# Patient Record
Sex: Female | Born: 1980 | Race: Black or African American | Hispanic: No | Marital: Married
Health system: Midwestern US, Community
[De-identification: ages and names within clinical notes are randomized; demographics above are authoritative.]

## PROBLEM LIST (undated history)

## (undated) ENCOUNTER — Inpatient Hospital Stay (HOSPITAL_COMMUNITY): Payer: Self-pay

## (undated) DIAGNOSIS — R87619 Unspecified abnormal cytological findings in specimens from cervix uteri: Secondary | ICD-10-CM

## (undated) DIAGNOSIS — I2699 Other pulmonary embolism without acute cor pulmonale: Secondary | ICD-10-CM

## (undated) DIAGNOSIS — B009 Herpesviral infection, unspecified: Secondary | ICD-10-CM

## (undated) HISTORY — DX: Unspecified abnormal cytological findings in specimens from cervix uteri: R87.619

## (undated) HISTORY — PX: NO PAST SURGERIES: SHX2092

## (undated) HISTORY — DX: Herpesviral infection, unspecified: B00.9

---

## 2013-05-03 DIAGNOSIS — I2699 Other pulmonary embolism without acute cor pulmonale: Secondary | ICD-10-CM | POA: Insufficient documentation

## 2013-06-06 DIAGNOSIS — Z1589 Genetic susceptibility to other disease: Secondary | ICD-10-CM | POA: Insufficient documentation

## 2013-10-31 DIAGNOSIS — G43909 Migraine, unspecified, not intractable, without status migrainosus: Secondary | ICD-10-CM | POA: Insufficient documentation

## 2014-07-13 NOTE — L&D Delivery Note (Cosign Needed)
Patient is 34 y.o. [id number] 1281w0d admitted for IOL, hx of PE now on Lovenox   Delivery Note At 2:51 PM a viable female was delivered via Vaginal, Spontaneous Delivery (Presentation: Left Occiput Anterior).  APGAR:9 ,9 ; weight pending .   Placenta status: Intact, Spontaneous.  Cord: 3 vessels with the following complications: None.   Anesthesia: None  Episiotomy: None Lacerations:  Superficial that did not require repair Suture Repair: n/a Est. Blood Loss (mL): 300cc   Mom to postpartum.  Baby to Couplet care / Skin to Skin.  Home Lovenox 40 scheduled to start 12 hours from now (0330am).  Delynn FlavinGottschalk, Ashly M, DO 10/22/2014, 3:17 PM   Patient is a 34 y.o. at 39w0 who was admitted for induction of labor secondary to history of PE (on Lovenox). Prenatal course was complicated by late to prenatal care, but otherwise uncomplicated. She progressed with augmentation via pitocin and AROM.  I was gloved and present for delivery in its entirety.  Second stage of labor progressed. She delivered a viable female over an intact perineum without medications.  Complications: none  Lacerations: none  EBL: 300  William DaltonMcEachern, Kenyetta Fife, MD 3:58 PM

## 2014-07-27 ENCOUNTER — Encounter: Payer: Self-pay | Admitting: Family

## 2014-07-27 ENCOUNTER — Ambulatory Visit (INDEPENDENT_AMBULATORY_CARE_PROVIDER_SITE_OTHER): Payer: Medicaid Other | Admitting: Family

## 2014-07-27 ENCOUNTER — Other Ambulatory Visit (HOSPITAL_COMMUNITY)
Admission: RE | Admit: 2014-07-27 | Discharge: 2014-07-27 | Disposition: A | Discharge status: Home / Self Care | Payer: Medicaid Other | Source: Ambulatory Visit | Attending: Family | Admitting: Family

## 2014-07-27 VITALS — BP 126/74 | HR 74 | Ht 70.0 in | Wt 155.0 lb

## 2014-07-27 DIAGNOSIS — B009 Herpesviral infection, unspecified: Secondary | ICD-10-CM | POA: Insufficient documentation

## 2014-07-27 DIAGNOSIS — O98519 Other viral diseases complicating pregnancy, unspecified trimester: Secondary | ICD-10-CM

## 2014-07-27 DIAGNOSIS — Z113 Encounter for screening for infections with a predominantly sexual mode of transmission: Secondary | ICD-10-CM | POA: Insufficient documentation

## 2014-07-27 DIAGNOSIS — Z23 Encounter for immunization: Secondary | ICD-10-CM | POA: Diagnosis not present

## 2014-07-27 DIAGNOSIS — Z01419 Encounter for gynecological examination (general) (routine) without abnormal findings: Secondary | ICD-10-CM | POA: Diagnosis not present

## 2014-07-27 DIAGNOSIS — O0932 Supervision of pregnancy with insufficient antenatal care, second trimester: Secondary | ICD-10-CM

## 2014-07-27 DIAGNOSIS — Z1151 Encounter for screening for human papillomavirus (HPV): Secondary | ICD-10-CM | POA: Diagnosis present

## 2014-07-27 DIAGNOSIS — O09892 Supervision of other high risk pregnancies, second trimester: Secondary | ICD-10-CM | POA: Insufficient documentation

## 2014-07-27 DIAGNOSIS — O09212 Supervision of pregnancy with history of pre-term labor, second trimester: Secondary | ICD-10-CM

## 2014-07-27 DIAGNOSIS — O0930 Supervision of pregnancy with insufficient antenatal care, unspecified trimester: Secondary | ICD-10-CM | POA: Insufficient documentation

## 2014-07-27 MED ORDER — INFLUENZA VAC SPLIT QUAD 0.5 ML IM SUSY
0.5000 mL | PREFILLED_SYRINGE | Freq: Once | INTRAMUSCULAR | Status: AC
  Administered 2014-07-27: 0.5 mL via INTRAMUSCULAR

## 2014-07-27 NOTE — Progress Notes (Signed)
Subjective:    Bridget Clark is a [id number] [redacted]w[redacted]d by uncertain LMP being seen today for her first obstetrical visit.  [place] moved from [place].  Her obstetrical history is significant for group B strep colonizer and preterm delivery with two term births.    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  Patient does intend to breast feed. Pregnancy history fully reviewed.  Patient reports no complaints.  Filed Vitals:   07/27/14 1054 08/02/14 1130  BP: 126/74   Pulse: 74   Height:   (1.778 m)  Weight: 70.308 kg (155 lb)     HISTORY: OB History  Gravida Para Term Preterm AB SAB TAB Ectopic Multiple Living  0 0 0 0 0 3    # Outcome Date GA Lbr Len/2nd Weight Sex Delivery Anes PTL Lv  4 Current           3 Term 12/10/07 [redacted]w[redacted]d  3.175 kg (7 lb) F Vag-Spont None N Y  2 Preterm 03/28/04 [redacted]w[redacted]d  2.722 kg (6 lb) M Vag-Spont None Y Y  1 Term 09/08/02 [redacted]w[redacted]d  3.487 kg (7 lb 11 oz) F Vag-Spont None N Y     Past Medical History  Diagnosis Date  . HSV-2 (herpes simplex virus 2) infection   . Abnormal Pap smear of cervix     2009, colpo, normal pap after   No past surgical history on file. Family History  Problem Relation Age of Onset  . Diabetes Mother     Exam   BP 126/74 mmHg  Pulse 74  Ht  (1.778 m)  Wt 70.308 kg (155 lb)  LMP 01/26/2014 (Approximate) Uterine Size: size equals dates  Pelvic Exam:    Perineum: No Hemorrhoids, Normal Perineum   Vulva: normal   Vagina:  normal mucosa, normal discharge, no palpable nodules   pH: Not done   Cervix: no bleeding following Pap, no cervical motion tenderness and no lesions   Adnexa: normal adnexa and no mass, fullness, tenderness   Bony Pelvis: Adequate  System: Breast:  No nipple retraction or dimpling, No nipple discharge or bleeding, No axillary or supraclavicular adenopathy, Normal to palpation without dominant masses   Skin: normal coloration and turgor, no rashes    Neurologic: negative   Extremities: normal strength, tone, and muscle mass   HEENT neck supple with midline trachea and thyroid without masses   Mouth/Teeth mucous membranes moist, pharynx normal without lesions   Neck supple and no masses   Cardiovascular: regular rate and rhythm, no murmurs or gallops   Respiratory:  appears well, vitals normal, no respiratory distress, acyanotic, normal RR, neck free of mass or lymphadenopathy, chest clear, no wheezing, crepitations, rhonchi, normal symmetric air entry   Abdomen: soft, non-tender; bowel sounds normal; no masses,  no organomegaly   Urinary: urethral meatus normal    Assessment:    Pregnancy:   34 yo G4P2106 at [redacted]w[redacted]d wks IUP Patient Active Problem List   Diagnosis Date Noted  . Encounter for fetal anatomic survey   . Late prenatal care affecting pregnancy in second trimester   . [redacted] weeks gestation of pregnancy   . Late prenatal care 07/27/2014  . History of preterm delivery, currently pregnant in second trimester 07/27/2014  . Herpes simplex infection during pregnancy, antepartum 07/27/2014        Plan:     Initial labs drawn. Prenatal vitamins. Problem list reviewed and updated. Genetic Screening:  Too late  Reviewed starting HSV-2 prophylaxis in late third trimester.   Ultrasound discussed; fetal survey: ordered.  Follow up in 2 weeks.  Marlis EdelsonKARIM, Costas Sena N 08/04/2014

## 2014-07-27 NOTE — Patient Instructions (Signed)
Third Trimester of Pregnancy The third trimester is from week 29 through week 42, months 7 through 9. The third trimester is a time when the fetus is growing rapidly. At the end of the ninth month, the fetus is about 20 inches in length and weighs 6-10 pounds.  BODY CHANGES Your body goes through many changes during pregnancy. The changes vary from woman to woman.   Your weight will continue to increase. You can expect to gain 25-35 pounds (11-16 kg) by the end of the pregnancy.  You may begin to get stretch marks on your hips, abdomen, and breasts.  You may urinate more often because the fetus is moving lower into your pelvis and pressing on your bladder.  You may develop or continue to have heartburn as a result of your pregnancy.  You may develop constipation because certain hormones are causing the muscles that push waste through your intestines to slow down.  You may develop hemorrhoids or swollen, bulging veins (varicose veins).  You may have pelvic pain because of the weight gain and pregnancy hormones relaxing your joints between the bones in your pelvis. Backaches may result from overexertion of the muscles supporting your posture.  You may have changes in your hair. These can include thickening of your hair, rapid growth, and changes in texture. Some women also have hair loss during or after pregnancy, or hair that feels dry or thin. Your hair will most likely return to normal after your baby is born.  Your breasts will continue to grow and be tender. A yellow discharge may leak from your breasts called colostrum.  Your belly button may stick out.  You may feel short of breath because of your expanding uterus.  You may notice the fetus "dropping," or moving lower in your abdomen.  You may have a bloody mucus discharge. This usually occurs a few days to a week before labor begins.  Your cervix becomes thin and soft (effaced) near your due date. WHAT TO EXPECT AT YOUR PRENATAL  EXAMS  You will have prenatal exams every 2 weeks until week 36. Then, you will have weekly prenatal exams. During a routine prenatal visit:  You will be weighed to make sure you and the fetus are growing normally.  Your blood pressure is taken.  Your abdomen will be measured to track your baby's growth.  The fetal heartbeat will be listened to.  Any test results from the previous visit will be discussed.  You may have a cervical check near your due date to see if you have effaced. At around 36 weeks, your caregiver will check your cervix. At the same time, your caregiver will also perform a test on the secretions of the vaginal tissue. This test is to determine if a type of bacteria, Group B streptococcus, is present. Your caregiver will explain this further. Your caregiver may ask you:  What your birth plan is.  How you are feeling.  If you are feeling the baby move.  If you have had any abnormal symptoms, such as leaking fluid, bleeding, severe headaches, or abdominal cramping.  If you have any questions. Other tests or screenings that may be performed during your third trimester include:  Blood tests that check for low iron levels (anemia).  Fetal testing to check the health, activity level, and growth of the fetus. Testing is done if you have certain medical conditions or if there are problems during the pregnancy. FALSE LABOR You may feel small, irregular contractions that   eventually go away. These are called Braxton Hicks contractions, or false labor. Contractions may last for hours, days, or even weeks before true labor sets in. If contractions come at regular intervals, intensify, or become painful, it is best to be seen by your caregiver.  SIGNS OF LABOR   Menstrual-like cramps.  Contractions that are 5 minutes apart or less.  Contractions that start on the top of the uterus and spread down to the lower abdomen and back.  A sense of increased pelvic pressure or back  pain.  A watery or bloody mucus discharge that comes from the vagina. If you have any of these signs before the 37th week of pregnancy, call your caregiver right away. You need to go to the hospital to get checked immediately. HOME CARE INSTRUCTIONS   Avoid all smoking, herbs, alcohol, and unprescribed drugs. These chemicals affect the formation and growth of the baby.  Follow your caregiver's instructions regarding medicine use. There are medicines that are either safe or unsafe to take during pregnancy.  Exercise only as directed by your caregiver. Experiencing uterine cramps is a good sign to stop exercising.  Continue to eat regular, healthy meals.  Wear a good support bra for breast tenderness.  Do not use hot tubs, steam rooms, or saunas.  Wear your seat belt at all times when driving.  Avoid raw meat, uncooked cheese, cat litter boxes, and soil used by cats. These carry germs that can cause birth defects in the baby.  Take your prenatal vitamins.  Try taking a stool softener (if your caregiver approves) if you develop constipation. Eat more high-fiber foods, such as fresh vegetables or fruit and whole grains. Drink plenty of fluids to keep your urine clear or pale yellow.  Take warm sitz baths to soothe any pain or discomfort caused by hemorrhoids. Use hemorrhoid cream if your caregiver approves.  If you develop varicose veins, wear support hose. Elevate your feet for 15 minutes, 3-4 times a day. Limit salt in your diet.  Avoid heavy lifting, wear low heal shoes, and practice good posture.  Rest a lot with your legs elevated if you have leg cramps or low back pain.  Visit your dentist if you have not gone during your pregnancy. Use a soft toothbrush to brush your teeth and be gentle when you floss.  A sexual relationship may be continued unless your caregiver directs you otherwise.  Do not travel far distances unless it is absolutely necessary and only with the approval  of your caregiver.  Take prenatal classes to understand, practice, and ask questions about the labor and delivery.  Make a trial run to the hospital.  Pack your hospital bag.  Prepare the baby's nursery.  Continue to go to all your prenatal visits as directed by your caregiver. SEEK MEDICAL CARE IF:  You are unsure if you are in labor or if your water has broken.  You have dizziness.  You have mild pelvic cramps, pelvic pressure, or nagging pain in your abdominal area.  You have persistent nausea, vomiting, or diarrhea.  You have a bad smelling vaginal discharge.  You have pain with urination. SEEK IMMEDIATE MEDICAL CARE IF:   You have a fever.  You are leaking fluid from your vagina.  You have spotting or bleeding from your vagina.  You have severe abdominal cramping or pain.  You have rapid weight loss or gain.  You have shortness of breath with chest pain.  You notice sudden or extreme swelling   of your face, hands, ankles, feet, or legs.  You have not felt your baby move in over an hour.  You have severe headaches that do not go away with medicine.  You have vision changes. Document Released: 06/23/2001 Document Revised: 07/04/2013 Document Reviewed: 08/30/2012 Exit[employer] Patient Information 2015 [employer]. This information is not intended to replace advice given to you by your health care provider. Make sure you discuss any questions you have with your health care provider.  

## 2014-07-28 LAB — HIV ANTIBODY (ROUTINE TESTING W REFLEX): HIV 1&2 Ab, 4th Generation: NONREACTIVE

## 2014-07-28 LAB — SICKLE CELL SCREEN: Sickle Cell Screen: NEGATIVE

## 2014-07-29 LAB — CULTURE, URINE COMPREHENSIVE
Colony Count: NO GROWTH
Organism ID, Bacteria: NO GROWTH

## 2014-07-30 LAB — OBSTETRIC PANEL
Antibody Screen: NEGATIVE
BASOS ABS: 0 10*3/uL (ref 0.0–0.1)
BASOS PCT: 0 % (ref 0–1)
EOS PCT: 2 % (ref 0–5)
Eosinophils Absolute: 0.2 10*3/uL (ref 0.0–0.7)
HCT: 36.8 % (ref 36.0–46.0)
Hemoglobin: 12.2 g/dL (ref 12.0–15.0)
Hepatitis B Surface Ag: NEGATIVE
LYMPHS ABS: 1.6 10*3/uL (ref 0.7–4.0)
LYMPHS PCT: 18 % (ref 12–46)
MCH: 30.8 pg (ref 26.0–34.0)
MCHC: 33.2 g/dL (ref 30.0–36.0)
MCV: 92.9 fL (ref 78.0–100.0)
MPV: 11 fL (ref 8.6–12.4)
Monocytes Absolute: 0.6 10*3/uL (ref 0.1–1.0)
Monocytes Relative: 7 % (ref 3–12)
NEUTROS PCT: 73 % (ref 43–77)
Neutro Abs: 6.4 10*3/uL (ref 1.7–7.7)
PLATELETS: 264 10*3/uL (ref 150–400)
RBC: 3.96 MIL/uL (ref 3.87–5.11)
RDW: 12.9 % (ref 11.5–15.5)
RUBELLA: 3.69 {index} — AB (ref <0.90)
Rh Type: POSITIVE
WBC: 8.8 10*3/uL (ref 4.0–10.5)

## 2014-07-30 LAB — CYTOLOGY - PAP

## 2014-08-02 ENCOUNTER — Encounter: Payer: Self-pay | Admitting: *Deleted

## 2014-08-02 ENCOUNTER — Ambulatory Visit (HOSPITAL_COMMUNITY)
Admission: RE | Admit: 2014-08-02 | Discharge: 2014-08-02 | Disposition: A | Discharge status: Home / Self Care | Payer: Medicaid Other | Source: Ambulatory Visit | Attending: Family | Admitting: Family

## 2014-08-02 DIAGNOSIS — Z3A27 27 weeks gestation of pregnancy: Secondary | ICD-10-CM | POA: Diagnosis not present

## 2014-08-02 DIAGNOSIS — O09212 Supervision of pregnancy with history of pre-term labor, second trimester: Secondary | ICD-10-CM | POA: Insufficient documentation

## 2014-08-02 DIAGNOSIS — Z36 Encounter for antenatal screening of mother: Secondary | ICD-10-CM | POA: Insufficient documentation

## 2014-08-02 DIAGNOSIS — O09892 Supervision of other high risk pregnancies, second trimester: Secondary | ICD-10-CM

## 2014-08-02 DIAGNOSIS — Z3483 Encounter for supervision of other normal pregnancy, third trimester: Secondary | ICD-10-CM

## 2014-08-02 DIAGNOSIS — O0932 Supervision of pregnancy with insufficient antenatal care, second trimester: Secondary | ICD-10-CM | POA: Insufficient documentation

## 2014-08-02 DIAGNOSIS — Z3689 Encounter for other specified antenatal screening: Secondary | ICD-10-CM | POA: Insufficient documentation

## 2014-08-03 ENCOUNTER — Encounter: Payer: Medicaid Other | Admitting: Family

## 2014-08-04 ENCOUNTER — Encounter: Payer: Self-pay | Admitting: Family

## 2014-08-06 ENCOUNTER — Encounter (INDEPENDENT_AMBULATORY_CARE_PROVIDER_SITE_OTHER): Payer: Medicaid Other | Admitting: *Deleted

## 2014-08-06 ENCOUNTER — Ambulatory Visit (INDEPENDENT_AMBULATORY_CARE_PROVIDER_SITE_OTHER): Payer: Medicaid Other | Admitting: Advanced Practice Midwife

## 2014-08-06 ENCOUNTER — Encounter: Payer: Self-pay | Admitting: Advanced Practice Midwife

## 2014-08-06 VITALS — BP 118/63 | HR 63 | Wt 158.0 lb

## 2014-08-06 DIAGNOSIS — Z23 Encounter for immunization: Secondary | ICD-10-CM

## 2014-08-06 DIAGNOSIS — Z3493 Encounter for supervision of normal pregnancy, unspecified, third trimester: Secondary | ICD-10-CM

## 2014-08-06 LAB — CBC
HCT: 34.2 % — ABNORMAL LOW (ref 36.0–46.0)
HEMOGLOBIN: 11.5 g/dL — AB (ref 12.0–15.0)
MCH: 30.9 pg (ref 26.0–34.0)
MCHC: 33.6 g/dL (ref 30.0–36.0)
MCV: 91.9 fL (ref 78.0–100.0)
MPV: 10.9 fL (ref 8.6–12.4)
Platelets: 224 10*3/uL (ref 150–400)
RBC: 3.72 MIL/uL — ABNORMAL LOW (ref 3.87–5.11)
RDW: 13.1 % (ref 11.5–15.5)
WBC: 8.3 10*3/uL (ref 4.0–10.5)

## 2014-08-07 LAB — RPR

## 2014-08-07 LAB — HIV ANTIBODY (ROUTINE TESTING W REFLEX): HIV 1&2 Ab, 4th Generation: NONREACTIVE

## 2014-08-07 LAB — GLUCOSE TOLERANCE, 1 HOUR (50G) W/O FASTING: GLUCOSE 1 HOUR GTT: 94 mg/dL (ref 70–140)

## 2014-08-07 NOTE — Progress Notes (Signed)
Glucola and labs today. Feels well. Denies contractions.

## 2014-08-07 NOTE — Patient Instructions (Signed)
Third Trimester of Pregnancy The third trimester is from week 29 through week 42, months 7 through 9. The third trimester is a time when the fetus is growing rapidly. At the end of the ninth month, the fetus is about 20 inches in length and weighs 6-10 pounds.  BODY CHANGES Your body goes through many changes during pregnancy. The changes vary from woman to woman.   Your weight will continue to increase. You can expect to gain 25-35 pounds (11-16 kg) by the end of the pregnancy.  You may begin to get stretch marks on your hips, abdomen, and breasts.  You may urinate more often because the fetus is moving lower into your pelvis and pressing on your bladder.  You may develop or continue to have heartburn as a result of your pregnancy.  You may develop constipation because certain hormones are causing the muscles that push waste through your intestines to slow down.  You may develop hemorrhoids or swollen, bulging veins (varicose veins).  You may have pelvic pain because of the weight gain and pregnancy hormones relaxing your joints between the bones in your pelvis. Backaches may result from overexertion of the muscles supporting your posture.  You may have changes in your hair. These can include thickening of your hair, rapid growth, and changes in texture. Some women also have hair loss during or after pregnancy, or hair that feels dry or thin. Your hair will most likely return to normal after your baby is born.  Your breasts will continue to grow and be tender. A yellow discharge may leak from your breasts called colostrum.  Your belly button may stick out.  You may feel short of breath because of your expanding uterus.  You may notice the fetus "dropping," or moving lower in your abdomen.  You may have a bloody mucus discharge. This usually occurs a few days to a week before labor begins.  Your cervix becomes thin and soft (effaced) near your due date. WHAT TO EXPECT AT YOUR PRENATAL  EXAMS  You will have prenatal exams every 2 weeks until week 36. Then, you will have weekly prenatal exams. During a routine prenatal visit:  You will be weighed to make sure you and the fetus are growing normally.  Your blood pressure is taken.  Your abdomen will be measured to track your baby's growth.  The fetal heartbeat will be listened to.  Any test results from the previous visit will be discussed.  You may have a cervical check near your due date to see if you have effaced. At around 36 weeks, your caregiver will check your cervix. At the same time, your caregiver will also perform a test on the secretions of the vaginal tissue. This test is to determine if a type of bacteria, Group B streptococcus, is present. Your caregiver will explain this further. Your caregiver may ask you:  What your birth plan is.  How you are feeling.  If you are feeling the baby move.  If you have had any abnormal symptoms, such as leaking fluid, bleeding, severe headaches, or abdominal cramping.  If you have any questions. Other tests or screenings that may be performed during your third trimester include:  Blood tests that check for low iron levels (anemia).  Fetal testing to check the health, activity level, and growth of the fetus. Testing is done if you have certain medical conditions or if there are problems during the pregnancy. FALSE LABOR You may feel small, irregular contractions that   eventually go away. These are called Braxton Hicks contractions, or false labor. Contractions may last for hours, days, or even weeks before true labor sets in. If contractions come at regular intervals, intensify, or become painful, it is best to be seen by your caregiver.  SIGNS OF LABOR   Menstrual-like cramps.  Contractions that are 5 minutes apart or less.  Contractions that start on the top of the uterus and spread down to the lower abdomen and back.  A sense of increased pelvic pressure or back  pain.  A watery or bloody mucus discharge that comes from the vagina. If you have any of these signs before the 37th week of pregnancy, call your caregiver right away. You need to go to the hospital to get checked immediately. HOME CARE INSTRUCTIONS   Avoid all smoking, herbs, alcohol, and unprescribed drugs. These chemicals affect the formation and growth of the baby.  Follow your caregiver's instructions regarding medicine use. There are medicines that are either safe or unsafe to take during pregnancy.  Exercise only as directed by your caregiver. Experiencing uterine cramps is a good sign to stop exercising.  Continue to eat regular, healthy meals.  Wear a good support bra for breast tenderness.  Do not use hot tubs, steam rooms, or saunas.  Wear your seat belt at all times when driving.  Avoid raw meat, uncooked cheese, cat litter boxes, and soil used by cats. These carry germs that can cause birth defects in the baby.  Take your prenatal vitamins.  Try taking a stool softener (if your caregiver approves) if you develop constipation. Eat more high-fiber foods, such as fresh vegetables or fruit and whole grains. Drink plenty of fluids to keep your urine clear or pale yellow.  Take warm sitz baths to soothe any pain or discomfort caused by hemorrhoids. Use hemorrhoid cream if your caregiver approves.  If you develop varicose veins, wear support hose. Elevate your feet for 15 minutes, 3-4 times a day. Limit salt in your diet.  Avoid heavy lifting, wear low heal shoes, and practice good posture.  Rest a lot with your legs elevated if you have leg cramps or low back pain.  Visit your dentist if you have not gone during your pregnancy. Use a soft toothbrush to brush your teeth and be gentle when you floss.  A sexual relationship may be continued unless your caregiver directs you otherwise.  Do not travel far distances unless it is absolutely necessary and only with the approval  of your caregiver.  Take prenatal classes to understand, practice, and ask questions about the labor and delivery.  Make a trial run to the hospital.  Pack your hospital bag.  Prepare the baby's nursery.  Continue to go to all your prenatal visits as directed by your caregiver. SEEK MEDICAL CARE IF:  You are unsure if you are in labor or if your water has broken.  You have dizziness.  You have mild pelvic cramps, pelvic pressure, or nagging pain in your abdominal area.  You have persistent nausea, vomiting, or diarrhea.  You have a bad smelling vaginal discharge.  You have pain with urination. SEEK IMMEDIATE MEDICAL CARE IF:   You have a fever.  You are leaking fluid from your vagina.  You have spotting or bleeding from your vagina.  You have severe abdominal cramping or pain.  You have rapid weight loss or gain.  You have shortness of breath with chest pain.  You notice sudden or extreme swelling   of your face, hands, ankles, feet, or legs.  You have not felt your baby move in over an hour.  You have severe headaches that do not go away with medicine.  You have vision changes. Document Released: 06/23/2001 Document Revised: 07/04/2013 Document Reviewed: 08/30/2012 Exit[employer] Patient Information 2015 [employer]. This information is not intended to replace advice given to you by your health care provider. Make sure you discuss any questions you have with your health care provider.  

## 2014-08-08 ENCOUNTER — Telehealth: Payer: Self-pay | Admitting: *Deleted

## 2014-08-08 NOTE — Telephone Encounter (Signed)
LM on voicemail of normal 1 hr GTT. 

## 2014-08-17 ENCOUNTER — Encounter (HOSPITAL_COMMUNITY): Payer: Self-pay | Admitting: *Deleted

## 2014-08-17 ENCOUNTER — Inpatient Hospital Stay (HOSPITAL_COMMUNITY)
Admission: AD | Admit: 2014-08-17 | Discharge: 2014-08-17 | Disposition: A | Discharge status: Home / Self Care | Payer: Medicaid Other | Source: Ambulatory Visit | Attending: Obstetrics & Gynecology | Admitting: Obstetrics & Gynecology

## 2014-08-17 DIAGNOSIS — O4703 False labor before 37 completed weeks of gestation, third trimester: Secondary | ICD-10-CM | POA: Diagnosis not present

## 2014-08-17 DIAGNOSIS — Z86711 Personal history of pulmonary embolism: Secondary | ICD-10-CM | POA: Diagnosis not present

## 2014-08-17 DIAGNOSIS — O09213 Supervision of pregnancy with history of pre-term labor, third trimester: Secondary | ICD-10-CM

## 2014-08-17 DIAGNOSIS — R109 Unspecified abdominal pain: Secondary | ICD-10-CM | POA: Diagnosis present

## 2014-08-17 DIAGNOSIS — Z3A29 29 weeks gestation of pregnancy: Secondary | ICD-10-CM | POA: Insufficient documentation

## 2014-08-17 DIAGNOSIS — O09893 Supervision of other high risk pregnancies, third trimester: Secondary | ICD-10-CM

## 2014-08-17 DIAGNOSIS — Z3A30 30 weeks gestation of pregnancy: Secondary | ICD-10-CM | POA: Diagnosis not present

## 2014-08-17 HISTORY — DX: Other pulmonary embolism without acute cor pulmonale: I26.99

## 2014-08-17 LAB — URINALYSIS, ROUTINE W REFLEX MICROSCOPIC
BILIRUBIN URINE: NEGATIVE
Glucose, UA: NEGATIVE mg/dL
Hgb urine dipstick: NEGATIVE
Ketones, ur: NEGATIVE mg/dL
Leukocytes, UA: NEGATIVE
Nitrite: NEGATIVE
PH: 6.5 (ref 5.0–8.0)
PROTEIN: NEGATIVE mg/dL
SPECIFIC GRAVITY, URINE: 1.01 (ref 1.005–1.030)
UROBILINOGEN UA: 0.2 mg/dL (ref 0.0–1.0)

## 2014-08-17 LAB — WET PREP, GENITAL
CLUE CELLS WET PREP: NONE SEEN
TRICH WET PREP: NONE SEEN
YEAST WET PREP: NONE SEEN

## 2014-08-17 NOTE — MAU Provider Note (Signed)
Chief Complaint:  Abdominal Pain   None     HPI: Bridget Clark is a 34 y.o. [phone] at 7029w4dwho presents to maternity admissions reporting menstrual-like cramping that became regular, every 7-8 minutes, at home before she came in to MAU. She describes the pain as mild, 3/10, but regular so she came in to get checked since she has had one preterm birth.  She has hx 1 term SVD, then an SVD with PTL at 36 weeks, then another term SVD. She is late to care in this pregnancy and too late for 17-P injections. She reports good fetal movement, denies LOF, vaginal bleeding, vaginal itching/burning, urinary symptoms, h/a, dizziness, n/v, or fever/chills.     Past Medical History: Past Medical History  Diagnosis Date  . HSV-2 (herpes simplex virus 2) infection   . Abnormal Pap smear of cervix     2009, colpo, normal pap after  . Pulmonary embolism     Past obstetric history: OB History  Gravida Para Term Preterm AB SAB TAB Ectopic Multiple Living  4 3 2 1  0 0 0 0 0 3    # Outcome Date GA Lbr Len/2nd Weight Sex Delivery Anes PTL Lv  4 Current           3 Term 12/10/07 6382w6d  3.175 kg (7 lb) F Vag-Spont None N Y  2 Preterm 03/28/04 6873w0d  2.722 kg (6 lb) M Vag-Spont None Y Y  1 Term 09/08/02 1630w0d  3.487 kg (7 lb 11 oz) F Vag-Spont None N Y      Past Surgical History: Past Surgical History  Procedure Laterality Date  . No past surgeries      Family History: Family History  Problem Relation Age of Onset  . Diabetes Mother     Social History: History  Substance Use Topics  . Smoking status: Never Smoker   . Smokeless tobacco: Never Used  . Alcohol Use: 0.0 oz/week    0 Not specified per week     Comment: occassional    Allergies:  Allergies  Allergen Reactions  . Sulfa Antibiotics Nausea And Vomiting    Meds:  No prescriptions prior to admission    ROS: Pertinent findings in history of present illness.  Physical Exam  Blood pressure 115/58, pulse 80, temperature 98.5  F (36.9 C), temperature source Oral, resp. rate 18, height 5\' 10"  (1.778 m), weight 74.503 kg (164 lb 4 oz), last menstrual period 01/26/2014. GENERAL: Well-developed, well-nourished female in no acute distress.  HEENT: normocephalic HEART: normal rate RESP: normal effort ABDOMEN: Soft, non-tender, gravid appropriate for gestational age EXTREMITIES: Nontender, no edema NEURO: alert and oriented  Dilation: Closed Effacement (%): Thick Cervical Position: Posterior Exam by:: L.Leftwich-Kirby,CNM  FHT:  Baseline 135 , moderate variability, accelerations present, no decelerations Contractions: initially with irritability Q 1-2 minutes, then ctx occasional, irregular   Labs: Results for orders placed or performed during the hospital encounter of 08/17/14 (from the past 24 hour(s))  Urinalysis, Routine w reflex microscopic     Status: None   Collection Time: 08/17/14  8:57 PM  Result Value Ref Range   Color, Urine YELLOW YELLOW   APPearance CLEAR CLEAR   Specific Gravity, Urine 1.010 1.005 - 1.030   pH 6.5 5.0 - 8.0   Glucose, UA NEGATIVE NEGATIVE mg/dL   Hgb urine dipstick NEGATIVE NEGATIVE   Bilirubin Urine NEGATIVE NEGATIVE   Ketones, ur NEGATIVE NEGATIVE mg/dL   Protein, ur NEGATIVE NEGATIVE mg/dL   Urobilinogen, UA 0.2  0.0 - 1.0 mg/dL   Nitrite NEGATIVE NEGATIVE   Leukocytes, UA NEGATIVE NEGATIVE  Wet prep, genital     Status: Abnormal   Collection Time: 08/17/14 10:00 PM  Result Value Ref Range   Yeast Wet Prep HPF POC NONE SEEN NONE SEEN   Trich, Wet Prep NONE SEEN NONE SEEN   Clue Cells Wet Prep HPF POC NONE SEEN NONE SEEN   WBC, Wet Prep HPF POC FEW (A) NONE SEEN   Assessment: 1. Preterm contractions, third trimester   2. History of preterm delivery, currently pregnant in third trimester     Plan: Discharge home PTL precautions and fetal kick counts Increase PO fluids Return to MAU if symptoms persist or worsen  Follow-up Information    Follow up with [facility].   Why:  Keep scheduled appointment   Contact information:   [address] [place] [address]        Medication List    TAKE these medications        PRENATAL VITAMINS PO  Take 1 tablet by mouth daily.        Sharen Counter Certified Nurse-Midwife 08/17/2014 11:31 PM

## 2014-08-17 NOTE — MAU Note (Signed)
Lower back pain started at 7:30 pm and then cramps in belly that seemed like every 7-10 min apart.  No bleeding and no leaking.   Baby moving well.

## 2014-08-20 ENCOUNTER — Encounter: Payer: Self-pay | Admitting: Advanced Practice Midwife

## 2014-08-20 ENCOUNTER — Ambulatory Visit (INDEPENDENT_AMBULATORY_CARE_PROVIDER_SITE_OTHER): Payer: Medicaid Other | Admitting: Advanced Practice Midwife

## 2014-08-20 ENCOUNTER — Encounter: Payer: Self-pay | Admitting: *Deleted

## 2014-08-20 VITALS — BP 103/66 | HR 90 | Wt 162.0 lb

## 2014-08-20 DIAGNOSIS — Z86711 Personal history of pulmonary embolism: Secondary | ICD-10-CM

## 2014-08-20 DIAGNOSIS — Z3483 Encounter for supervision of other normal pregnancy, third trimester: Secondary | ICD-10-CM

## 2014-08-20 MED ORDER — ENOXAPARIN SODIUM 40 MG/0.4ML ~~LOC~~ SOLN: 40.0000 mg | SUBCUTANEOUS | Status: AC

## 2014-08-20 NOTE — Patient Instructions (Signed)
Pulmonary Embolism A pulmonary (lung) embolism (PE) is a blood clot that has traveled to the lung and results in a blockage of blood flow in the affected lung. Most clots come from deep veins in the legs or pelvis. PE is a dangerous and potentially life-threatening condition that can be treated if identified. CAUSES Blood clots form in a vein for different reasons. Usually several things cause blood clots. They include:  The flow of blood slows down.  The inside of the vein is damaged in some way.  The person has a condition that makes the blood clot more easily. RISK FACTORS Some people are more likely than others to develop PE. Risk factors include:   Smoking.  Being overweight (obese).  Sitting or lying still for a long time. This includes long-distance travel, paralysis, or recovery from an illness or surgery. Other factors that increase risk are:   Older age, especially over 75 years of age.  Having a family history of blood clots or if you have already had a blood clot.  Having major or lengthy surgery. This is especially true for surgery on the hip, knee, or belly (abdomen). Hip surgery is particularly high risk.  Having a long, thin tube (catheter) placed inside a vein during a medical procedure.  Breaking a hip or leg.  Having cancer or cancer treatment.  Medicines containing the female hormone estrogen. This includes birth control pills and hormone replacement therapy.  Other circulation or heart problems.  Pregnancy and childbirth.  Hormone changes make the blood clot more easily during pregnancy.  The fetus puts pressure on the veins of the pelvis.  There is a risk of injury to veins during delivery or a caesarean delivery. The risk is highest just after childbirth.  PREVENTION   Exercise the legs regularly. Take a brisk 30 minute walk every day.  Maintain a weight that is appropriate for your height.  Avoid sitting or lying in bed for long periods of  time without moving your legs.  Women, particularly those over the age of 35 years, should consider the risks and benefits of taking estrogen medicines, including birth control pills.  Do not smoke, especially if you take estrogen medicines.  Long-distance travel can increase your risk. You should exercise your legs by walking or pumping the muscles every hour.  Many of the risk factors above relate to situations that exist with hospitalization, either for illness, injury, or elective surgery. Prevention may include medical and nonmedical measures.   Your health care provider will assess you for the need for venous thromboembolism prevention when you are admitted to the hospital. If you are having surgery, your surgeon will assess you the day of or day after surgery.  SYMPTOMS  The symptoms of a PE usually start suddenly and include:  Shortness of breath.  Coughing.  Coughing up blood or blood-tinged mucus.  Chest pain. Pain is often worse with deep breaths.  Rapid heartbeat. DIAGNOSIS  If a PE is suspected, your health care provider will take a medical history and perform a physical exam. Other tests that may be required include:  Blood tests, such as studies of the clotting properties of your blood.  Imaging tests, such as ultrasound, CT, MRI, and other tests to see if you have clots in your legs or lungs.  An electrocardiogram. This can look for heart strain from blood clots in the lungs. TREATMENT   The most common treatment for a PE is blood thinning (anticoagulant) medicine, which reduces   the blood's tendency to clot. Anticoagulants can stop new blood clots from forming and old clots from growing. They cannot dissolve existing clots. Your body does this by itself over time. Anticoagulants can be given by mouth, through an intravenous (IV) tube, or by injection. Your health care provider will determine the best program for you.  Less commonly, clot-dissolving medicines  (thrombolytics) are used to dissolve a PE. They carry a high risk of bleeding, so they are used mainly in severe cases.  Very rarely, a blood clot in the leg needs to be removed surgically.  If you are unable to take anticoagulants, your health care provider may arrange for you to have a filter placed in a main vein in your abdomen. This filter prevents clots from traveling to your lungs. HOME CARE INSTRUCTIONS   Take all medicines as directed by your health care provider.  Learn as much as you can about DVT.  Wear a medical alert bracelet or carry a medical alert card.  Ask your health care provider how soon you can go back to normal activities. It is important to stay active to prevent blood clots. If you are on anticoagulant medicine, avoid contact sports.  It is very important to exercise. This is especially important while traveling, sitting, or standing for long periods of time. Exercise your legs by walking or by tightening and relaxing your leg muscles regularly. Take frequent walks.  You may need to wear compression stockings. These are tight elastic stockings that apply pressure to the lower legs. This pressure can help keep the blood in the legs from clotting. Taking Warfarin Warfarin is a daily medicine that is taken by mouth. Your health care provider will advise you on the length of treatment (usually 3-6 months, sometimes lifelong). If you take warfarin:  Understand how to take warfarin and foods that can affect how warfarin works in your body.  Too much and too little warfarin are both dangerous. Too much warfarin increases the risk of bleeding. Too little warfarin continues to allow the risk for blood clots. Warfarin and Regular Blood Testing While taking warfarin, you will need to have regular blood tests to measure your blood clotting time. These blood tests usually include both the prothrombin time (PT) and international normalized ratio (INR) tests. The PT and INR  results allow your health care provider to adjust your dose of warfarin. It is very important that you have your PT and INR tested as often as directed by your health care provider.  Warfarin and Your Diet Avoid major changes in your diet, or notify your health care provider before changing your diet. Arrange a visit with a registered dietitian to answer your questions. Many foods, especially foods high in vitamin K, can interfere with warfarin and affect the PT and INR results. You should eat a consistent amount of foods high in vitamin K. Foods high in vitamin K include:   Spinach, kale, broccoli, cabbage, collard and turnip greens, Brussels sprouts, peas, cauliflower, seaweed, and parsley.  Beef and pork liver.  Green tea.  Soybean oil. Warfarin with Other Medicines Many medicines can interfere with warfarin and affect the PT and INR results. You must:  Tell your health care provider about any and all medicines, vitamins, and supplements you take, including aspirin and other over-the-counter anti-inflammatory medicines. Be especially cautious with aspirin and anti-inflammatory medicines. Ask your health care provider before taking these.  Do not take or discontinue any prescribed or over-the-counter medicine except on the advice   of your health care provider or pharmacist. Warfarin Side Effects Warfarin can have side effects, such as easy bruising and difficulty stopping bleeding. Ask your health care provider or pharmacist about other side effects of warfarin. You will need to:  Hold pressure over cuts for longer than usual.  Notify your dentist and other health care providers that you are taking warfarin before you undergo any procedures where bleeding may occur. Warfarin with Alcohol and Tobacco   Drinking alcohol frequently can increase the effect of warfarin, leading to excess bleeding. It is best to avoid alcoholic drinks or consume only very small amounts while taking warfarin.  Notify your health care provider if you change your alcohol intake.  Do not use any tobacco products including cigarettes, chewing tobacco, or electronic cigarettes. If you smoke, quit. Ask your health care provider for help with quitting smoking. Alternative Medicines to Warfarin: Factor Xa Inhibitor Medicines  These blood thinning medicines are taken by mouth, usually for several weeks or longer. It is important to take the medicine every single day, at the same time each day.  There are no regular blood tests required when using these medicines.  There are fewer food and drug interactions than with warfarin.  The side effects of this class of medicine is similar to that of warfarin, including excessive bruising or bleeding. Ask your health care provider or pharmacist about other potential side effects. SEEK MEDICAL CARE IF:   You notice a rapid heartbeat.  You feel weaker or more tired than usual.  You feel faint.  You notice increased bruising.  Your symptoms are not getting better in the time expected.  You are having side effects of medicine. SEEK IMMEDIATE MEDICAL CARE IF:   You have chest pain.  You have trouble breathing.  You have new or increased swelling or pain in one leg.  You cough up blood.  You notice blood in vomit, in a bowel movement, or in urine.  You have a fever. Symptoms of PE may represent a serious problem that is an emergency. Do not wait to see if the symptoms will go away. Get medical help right away. Call your local emergency services (911 in the [place]). Do not drive yourself to the hospital. Document Released: 06/26/2000 Document Revised: 11/13/2013 Document Reviewed: 07/10/2013 [employer] Patient Information 2015 [employer]. This information is not intended to replace advice given to you by your health care provider. Make sure you discuss any questions you have with your health care provider.  

## 2014-08-21 ENCOUNTER — Encounter: Payer: Self-pay | Admitting: Advanced Practice Midwife

## 2014-08-21 ENCOUNTER — Other Ambulatory Visit (INDEPENDENT_AMBULATORY_CARE_PROVIDER_SITE_OTHER): Payer: Medicaid Other

## 2014-08-21 DIAGNOSIS — Z86711 Personal history of pulmonary embolism: Secondary | ICD-10-CM

## 2014-08-21 NOTE — Progress Notes (Signed)
Pt here with her Lovenox.  She was shown how to give a SQ injection and the sites.  Pt repeats the demonstration back.  She will place her needles in a coke bottle with a screw lid and return them to us for disposal.

## 2014-08-21 NOTE — Progress Notes (Signed)
Upon review of her history, I noted she has a history of Pulmonary Embolism with RLL infarct.  She states it was about a year ago and she was not smoking or on OCPs but she did have a Mirena IUD. Records show that she WAS on oral contraceptives.  She was on Xarelto for 6 months.  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 They both recommend Preventative dose of Daily Lovenox 40mg . Rx provided and Vernona RiegerLaura will do teaching.

## 2014-08-27 ENCOUNTER — Ambulatory Visit (HOSPITAL_COMMUNITY): Payer: Medicaid Other

## 2014-08-30 ENCOUNTER — Ambulatory Visit (HOSPITAL_COMMUNITY)
Admission: RE | Admit: 2014-08-30 | Discharge: 2014-08-30 | Disposition: A | Discharge status: Home / Self Care | Payer: Medicaid Other | Source: Ambulatory Visit | Attending: Advanced Practice Midwife | Admitting: Advanced Practice Midwife

## 2014-08-30 DIAGNOSIS — Z36 Encounter for antenatal screening of mother: Secondary | ICD-10-CM | POA: Insufficient documentation

## 2014-08-30 DIAGNOSIS — Z8759 Personal history of other complications of pregnancy, childbirth and the puerperium: Secondary | ICD-10-CM

## 2014-08-30 DIAGNOSIS — Z3A31 31 weeks gestation of pregnancy: Secondary | ICD-10-CM | POA: Insufficient documentation

## 2014-08-30 DIAGNOSIS — IMO0002 Reserved for concepts with insufficient information to code with codable children: Secondary | ICD-10-CM | POA: Insufficient documentation

## 2014-08-30 DIAGNOSIS — Z3493 Encounter for supervision of normal pregnancy, unspecified, third trimester: Secondary | ICD-10-CM

## 2014-08-30 DIAGNOSIS — O0933 Supervision of pregnancy with insufficient antenatal care, third trimester: Secondary | ICD-10-CM | POA: Diagnosis not present

## 2014-08-30 DIAGNOSIS — O352XX0 Maternal care for (suspected) hereditary disease in fetus, not applicable or unspecified: Secondary | ICD-10-CM | POA: Diagnosis not present

## 2014-08-30 DIAGNOSIS — Z86711 Personal history of pulmonary embolism: Secondary | ICD-10-CM | POA: Insufficient documentation

## 2014-08-30 DIAGNOSIS — Z0489 Encounter for examination and observation for other specified reasons: Secondary | ICD-10-CM | POA: Insufficient documentation

## 2014-09-03 ENCOUNTER — Ambulatory Visit (INDEPENDENT_AMBULATORY_CARE_PROVIDER_SITE_OTHER): Payer: Medicaid Other | Admitting: Advanced Practice Midwife

## 2014-09-03 VITALS — BP 110/68 | HR 87 | Wt 164.0 lb

## 2014-09-03 DIAGNOSIS — O0932 Supervision of pregnancy with insufficient antenatal care, second trimester: Secondary | ICD-10-CM

## 2014-09-03 DIAGNOSIS — O09893 Supervision of other high risk pregnancies, third trimester: Secondary | ICD-10-CM

## 2014-09-03 NOTE — Patient Instructions (Signed)
Braxton Hicks Contractions °Contractions of the uterus can occur throughout pregnancy. Contractions are not always a sign that you are in labor.  °WHAT ARE BRAXTON HICKS CONTRACTIONS?  °Contractions that occur before labor are called Braxton Hicks contractions, or false labor. Toward the end of pregnancy (32-34 weeks), these contractions can develop more often and may become more forceful. This is not true labor because these contractions do not result in opening (dilatation) and thinning of the cervix. They are sometimes difficult to tell apart from true labor because these contractions can be forceful and people have different pain tolerances. You should not feel embarrassed if you go to the hospital with false labor. Sometimes, the only way to tell if you are in true labor is for your health care provider to look for changes in the cervix. °If there are no prenatal problems or other health problems associated with the pregnancy, it is completely safe to be sent home with false labor and await the onset of true labor. °HOW CAN YOU TELL THE DIFFERENCE BETWEEN TRUE AND FALSE LABOR? °False Labor °· The contractions of false labor are usually shorter and not as hard as those of true labor.   °· The contractions are usually irregular.   °· The contractions are often felt in the front of the lower abdomen and in the groin.   °· The contractions may go away when you walk around or change positions while lying down.   °· The contractions get weaker and are shorter lasting as time goes on.   °· The contractions do not usually become progressively stronger, regular, and closer together as with true labor.   °True Labor °· Contractions in true labor last 30-70 seconds, become very regular, usually become more intense, and increase in frequency.   °· The contractions do not go away with walking.   °· The discomfort is usually felt in the top of the uterus and spreads to the lower abdomen and low back.   °· True labor can be  determined by your health care provider with an exam. This will show that the cervix is dilating and getting thinner.   °WHAT TO REMEMBER °· Keep up with your usual exercises and follow other instructions given by your health care provider.   °· Take medicines as directed by your health care provider.   °· Keep your regular prenatal appointments.   °· Eat and drink lightly if you think you are going into labor.   °· If Braxton Hicks contractions are making you uncomfortable:   °¨ Change your position from lying down or resting to walking, or from walking to resting.   °¨ Sit and rest in a tub of warm water.   °¨ Drink 2-3 glasses of water. Dehydration may cause these contractions.   °¨ Do slow and deep breathing several times an hour.   °WHEN SHOULD I SEEK IMMEDIATE MEDICAL CARE? °Seek immediate medical care if: °· Your contractions become stronger, more regular, and closer together.   °· You have fluid leaking or gushing from your vagina.   °· You have a fever.   °· You pass blood-tinged mucus.   °· You have vaginal bleeding.   °· You have continuous abdominal pain.   °· You have low back pain that you never had before.   °· You feel your baby's head pushing down and causing pelvic pressure.   °· Your baby is not moving as much as it used to.   °Document Released: 06/29/2005 Document Revised: 07/04/2013 Document Reviewed: 04/10/2013 °[employer]® Patient Information ©2015 [employer]. This information is not intended to replace advice given to you by your health care   provider. Make sure you discuss any questions you have with your health care provider. ° °

## 2014-09-17 ENCOUNTER — Ambulatory Visit (INDEPENDENT_AMBULATORY_CARE_PROVIDER_SITE_OTHER): Payer: Medicaid Other | Admitting: Advanced Practice Midwife

## 2014-09-17 VITALS — BP 116/74 | HR 98 | Wt 168.0 lb

## 2014-09-17 DIAGNOSIS — O98519 Other viral diseases complicating pregnancy, unspecified trimester: Secondary | ICD-10-CM

## 2014-09-17 DIAGNOSIS — O0933 Supervision of pregnancy with insufficient antenatal care, third trimester: Secondary | ICD-10-CM

## 2014-09-17 DIAGNOSIS — O98513 Other viral diseases complicating pregnancy, third trimester: Secondary | ICD-10-CM

## 2014-09-17 DIAGNOSIS — B009 Herpesviral infection, unspecified: Secondary | ICD-10-CM

## 2014-09-17 MED ORDER — VALACYCLOVIR HCL 1 G PO TABS: 1000.0000 mg | ORAL_TABLET | Freq: Every day | ORAL | Status: AC

## 2014-09-17 NOTE — Progress Notes (Signed)
Dr. Katherina Rightenny, MFM recommends IOL at 39 weeks due to Lovenox therapy. Rx Valtrex for Hx HSV.

## 2014-09-17 NOTE — Patient Instructions (Signed)
Braxton Hicks Contractions °Contractions of the uterus can occur throughout pregnancy. Contractions are not always a sign that you are in labor.  °WHAT ARE BRAXTON HICKS CONTRACTIONS?  °Contractions that occur before labor are called Braxton Hicks contractions, or false labor. Toward the end of pregnancy (32-34 weeks), these contractions can develop more often and may become more forceful. This is not true labor because these contractions do not result in opening (dilatation) and thinning of the cervix. They are sometimes difficult to tell apart from true labor because these contractions can be forceful and people have different pain tolerances. You should not feel embarrassed if you go to the hospital with false labor. Sometimes, the only way to tell if you are in true labor is for your health care provider to look for changes in the cervix. °If there are no prenatal problems or other health problems associated with the pregnancy, it is completely safe to be sent home with false labor and await the onset of true labor. °HOW CAN YOU TELL THE DIFFERENCE BETWEEN TRUE AND FALSE LABOR? °False Labor °· The contractions of false labor are usually shorter and not as hard as those of true labor.   °· The contractions are usually irregular.   °· The contractions are often felt in the front of the lower abdomen and in the groin.   °· The contractions may go away when you walk around or change positions while lying down.   °· The contractions get weaker and are shorter lasting as time goes on.   °· The contractions do not usually become progressively stronger, regular, and closer together as with true labor.   °True Labor °· Contractions in true labor last 30-70 seconds, become very regular, usually become more intense, and increase in frequency.   °· The contractions do not go away with walking.   °· The discomfort is usually felt in the top of the uterus and spreads to the lower abdomen and low back.   °· True labor can be  determined by your health care provider with an exam. This will show that the cervix is dilating and getting thinner.   °WHAT TO REMEMBER °· Keep up with your usual exercises and follow other instructions given by your health care provider.   °· Take medicines as directed by your health care provider.   °· Keep your regular prenatal appointments.   °· Eat and drink lightly if you think you are going into labor.   °· If Braxton Hicks contractions are making you uncomfortable:   °¨ Change your position from lying down or resting to walking, or from walking to resting.   °¨ Sit and rest in a tub of warm water.   °¨ Drink 2-3 glasses of water. Dehydration may cause these contractions.   °¨ Do slow and deep breathing several times an hour.   °WHEN SHOULD I SEEK IMMEDIATE MEDICAL CARE? °Seek immediate medical care if: °· Your contractions become stronger, more regular, and closer together.   °· You have fluid leaking or gushing from your vagina.   °· You have a fever.   °· You pass blood-tinged mucus.   °· You have vaginal bleeding.   °· You have continuous abdominal pain.   °· You have low back pain that you never had before.   °· You feel your baby's head pushing down and causing pelvic pressure.   °· Your baby is not moving as much as it used to.   °Document Released: 06/29/2005 Document Revised: 07/04/2013 Document Reviewed: 04/10/2013 °[employer]® Patient Information ©2015 [employer]. This information is not intended to replace advice given to you by your health care   provider. Make sure you discuss any questions you have with your health care provider. ° °

## 2014-09-28 ENCOUNTER — Ambulatory Visit (HOSPITAL_COMMUNITY)
Admission: RE | Admit: 2014-09-28 | Discharge: 2014-09-28 | Disposition: A | Discharge status: Home / Self Care | Payer: Medicaid Other | Source: Ambulatory Visit | Attending: Advanced Practice Midwife | Admitting: Advanced Practice Midwife

## 2014-09-28 DIAGNOSIS — O09893 Supervision of other high risk pregnancies, third trimester: Secondary | ICD-10-CM | POA: Diagnosis present

## 2014-09-28 DIAGNOSIS — O2690 Pregnancy related conditions, unspecified, unspecified trimester: Secondary | ICD-10-CM | POA: Insufficient documentation

## 2014-09-28 DIAGNOSIS — Z3A35 35 weeks gestation of pregnancy: Secondary | ICD-10-CM | POA: Insufficient documentation

## 2014-09-28 IMAGING — US US OB FOLLOW-UP
1 series · 12 of 28 positions shown · non-contrast
Comparison: none

[Series 1: us ob follow up · 53 acquisitions, 12 frames shown]
[im 2/53]
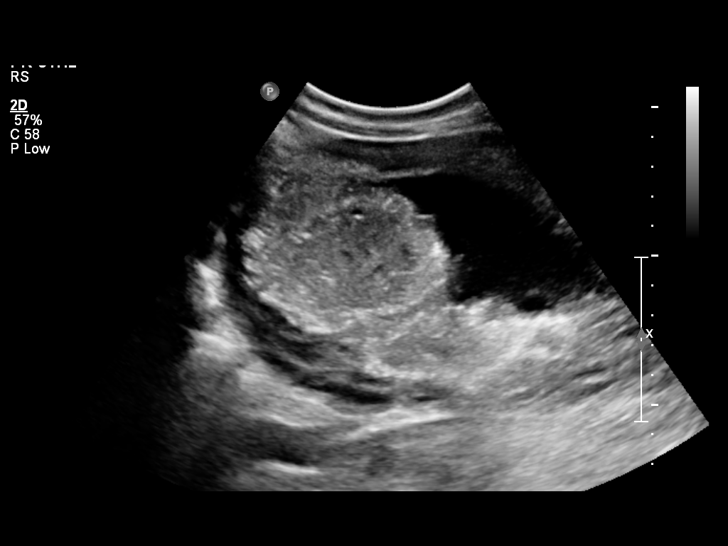
[im 6/53]
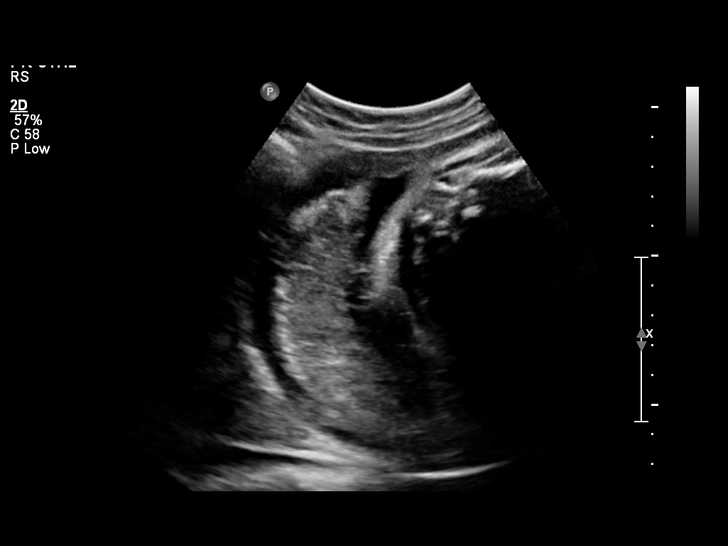
[im 10/53]
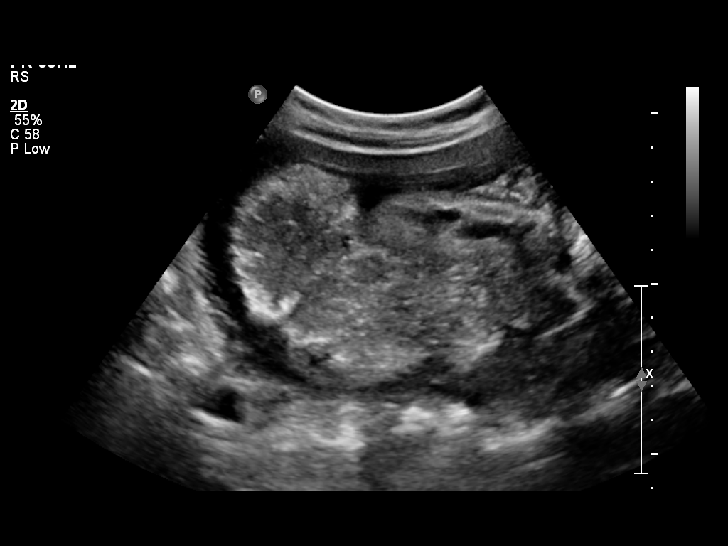
[im 16/53]
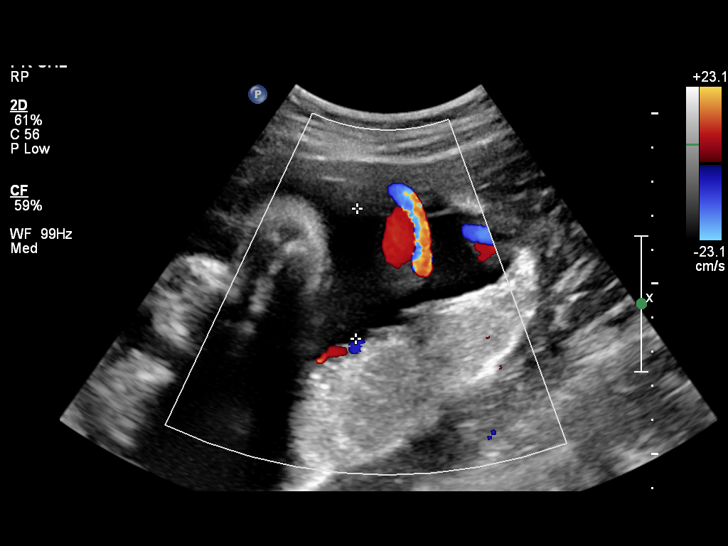
[im 20/53]
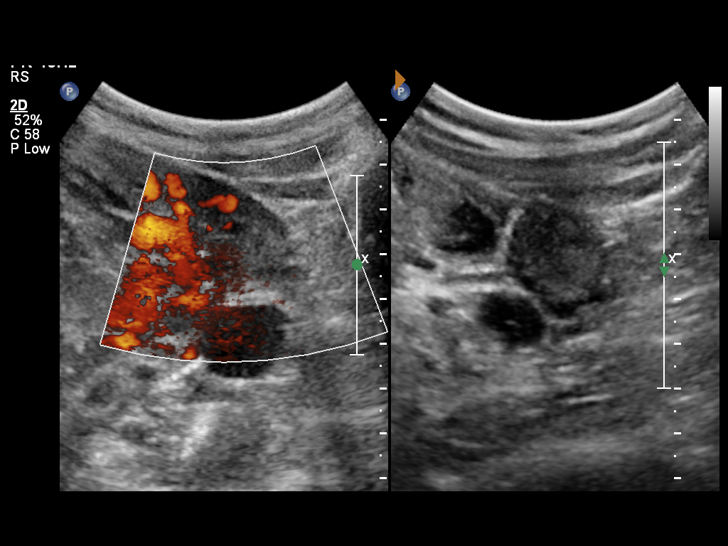
[im 24/53]
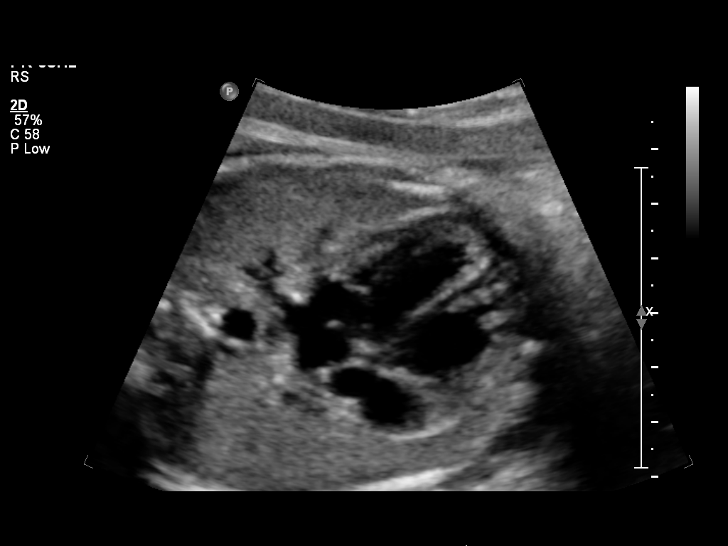
[im 29/53]
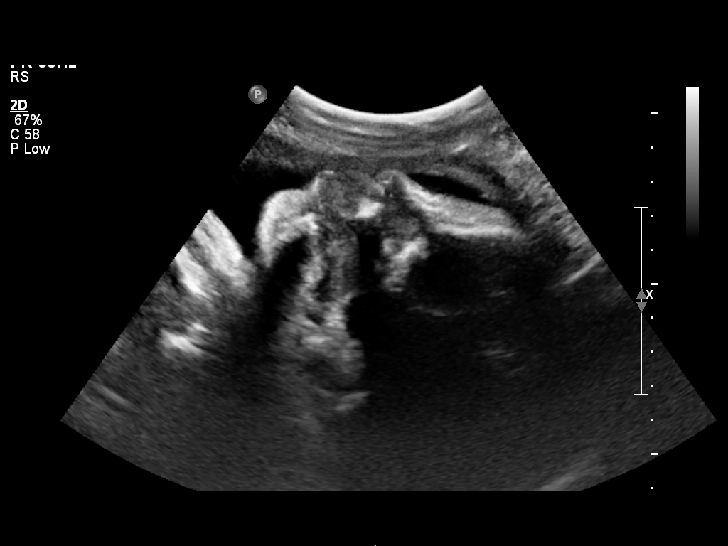
[im 33/53]
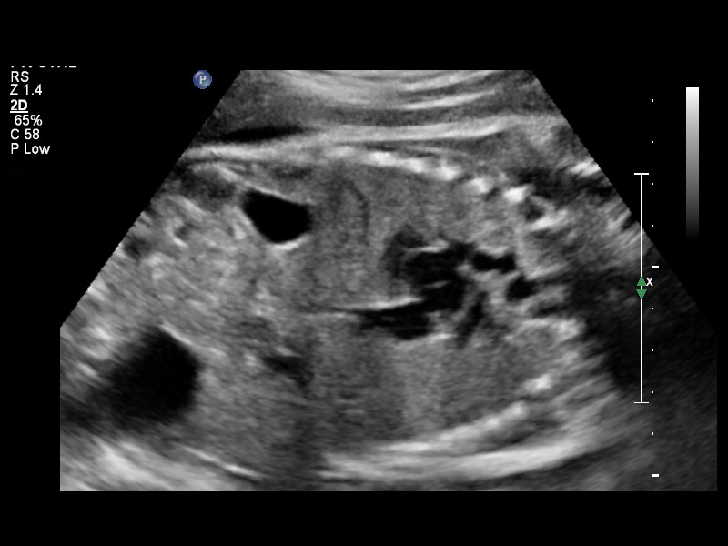
[im 37/53]
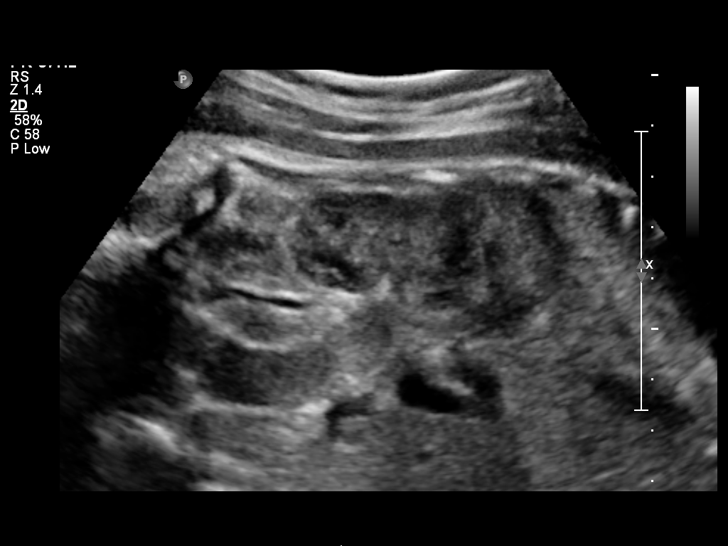
[im 43/53]
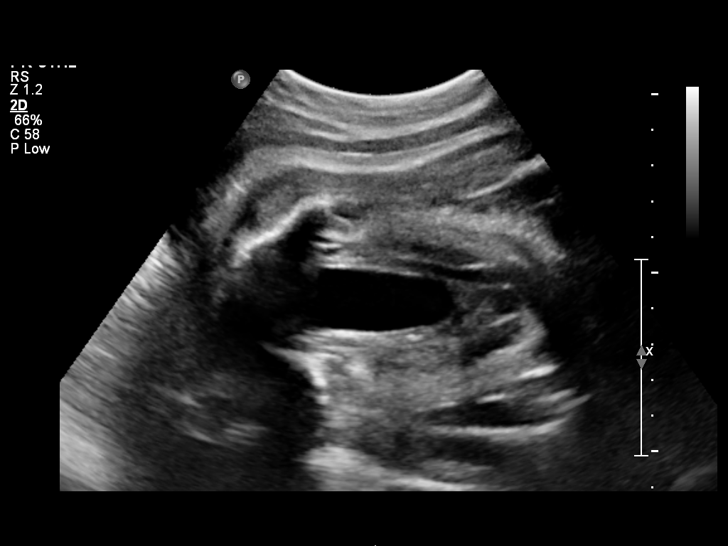
[im 47/53]
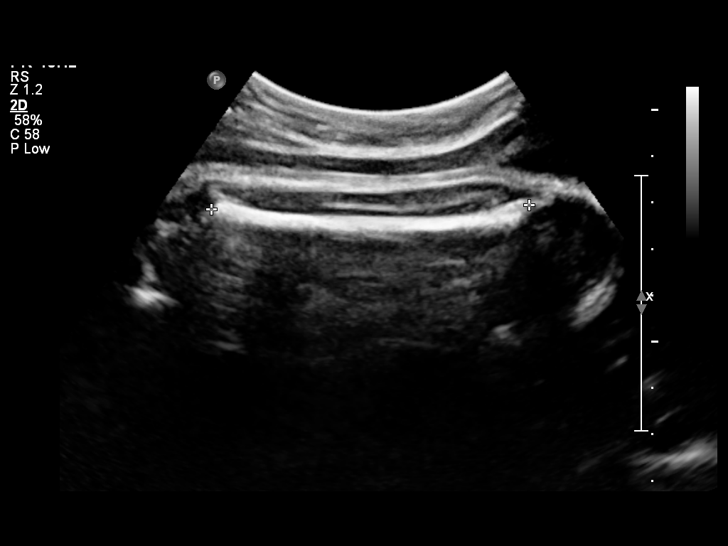
[im 51/53]
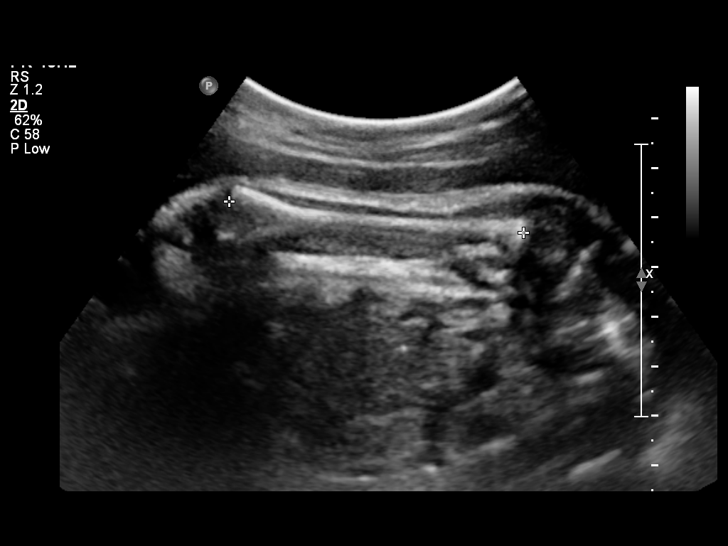

[12 of 28 positions shown; findings below may reference images not displayed]

OBSTETRICS REPORT

Service(s) Provided

 US OB FOLLOW UP                                       76816.1
Indications

 35 weeks gestation of pregnancy
 No or Little Prenatal Care                            [A7]
 Poor obstetric history: Previous preterm delivery 36  [A7]
 weeks
 Medical complication of pregnancy (specify);          [A7]
 history of PE on Lovenox
Fetal Evaluation

 Num Of Fetuses:    1
 Fetal Heart Rate:  138                          bpm
 Cardiac Activity:  Observed
 Presentation:      Cephalic
 Placenta:          Posterior Fundal, above
                    cervical os

 Amniotic Fluid
 AFI FV:      Subjectively within normal limits
 AFI Sum:     14.15   cm       51  %Tile     Larg Pckt:    4.53  cm
 RUQ:   2.87    cm   RLQ:    2.95   cm    LUQ:   4.53    cm   LLQ:    3.8    cm
Biometry

 BPD:     87.2  mm     G. Age:  35w 1d                CI:        79.27   70 - 86
                                                      FL/HC:      22.2   20.1 -

 HC:     309.6  mm     G. Age:  34w 4d        7  %    HC/AC:      1.01   0.93 -

 AC:     307.7  mm     G. Age:  34w 5d       34  %    FL/BPD:     78.9   71 - 87
 FL:      68.8  mm     G. Age:  35w 2d       38  %    FL/AC:      22.4   20 - 24
 HUM:     59.8  mm     G. Age:  34w 5d       47  %

 Est. FW:    [A7]  gm    5 lb 10 oz      49  %
Gestational Age

 LMP:           35w 0d        Date:  [DATE]                 EDD:   [DATE]
 U/S Today:     34w 6d                                        EDD:   [DATE]
 Best:          35w 4d     Det. By:  U/S ([DATE])           EDD:   [DATE]
Anatomy

 Cranium:          Appears normal         Aortic Arch:      Previously seen
 Fetal Cavum:      Previously seen        Ductal Arch:      Previously seen
 Ventricles:       Previously seen        Diaphragm:        Appears normal
 Choroid Plexus:   Previously seen        Stomach:          Appears normal, left
                                                            sided
 Cerebellum:       Previously seen        Abdomen:          Appears normal
 Posterior Fossa:  Previously seen        Abdominal Wall:   Previously seen
 Nuchal Fold:      Not applicable (>20    Cord Vessels:     Previously seen
                   wks GA)
 Face:             Orbits and profile     Kidneys:          Appear normal
                   previously seen
 Lips:             Previously seen        Bladder:          Appears normal
 Heart:            Appears normal         Spine:            Previously seen
                   (4CH, axis, and
                   situs)
 RVOT:             Previously seen        Lower             Previously seen
                                          Extremities:
 LVOT:             Appears normal         Upper             Previously seen
                                          Extremities:

 Other:  Fetus appears to be a male. Technically difficult due to advanced GA
         and fetal position.
Cervix Uterus Adnexa

 Cervix:       Not visualized (advanced GA >[A7])
 Uterus:       No abnormality visualized.

 Left Ovary:    Within normal limits.
 Right Ovary:   Within normal limits.
 Adnexa:     No adnexal mass visualized.
Impression

 SIUP at 35+4 weeks
 Normal interval anatomy; anatomic survey complete
 Normal amniotic fluid volume
 Appropriate interval growth with EFW at the 49th %tile
Recommendations

 Follow-up as clinically indicated

 questions or concerns.

## 2014-10-01 ENCOUNTER — Other Ambulatory Visit: Payer: Self-pay | Admitting: Advanced Practice Midwife

## 2014-10-01 ENCOUNTER — Ambulatory Visit (INDEPENDENT_AMBULATORY_CARE_PROVIDER_SITE_OTHER): Payer: Medicaid Other | Admitting: Advanced Practice Midwife

## 2014-10-01 VITALS — BP 107/61 | HR 89 | Wt 171.0 lb

## 2014-10-01 DIAGNOSIS — O0930 Supervision of pregnancy with insufficient antenatal care, unspecified trimester: Secondary | ICD-10-CM

## 2014-10-01 DIAGNOSIS — O09893 Supervision of other high risk pregnancies, third trimester: Secondary | ICD-10-CM

## 2014-10-01 DIAGNOSIS — Z36 Encounter for antenatal screening of mother: Secondary | ICD-10-CM

## 2014-10-01 LAB — OB RESULTS CONSOLE GBS: STREP GROUP B AG: POSITIVE

## 2014-10-01 NOTE — Patient Instructions (Addendum)
Braxton Hicks Contractions °Contractions of the uterus can occur throughout pregnancy. Contractions are not always a sign that you are in labor.  °WHAT ARE BRAXTON HICKS CONTRACTIONS?  °Contractions that occur before labor are called Braxton Hicks contractions, or false labor. Toward the end of pregnancy (32-34 weeks), these contractions can develop more often and may become more forceful. This is not true labor because these contractions do not result in opening (dilatation) and thinning of the cervix. They are sometimes difficult to tell apart from true labor because these contractions can be forceful and people have different pain tolerances. You should not feel embarrassed if you go to the hospital with false labor. Sometimes, the only way to tell if you are in true labor is for your health care provider to look for changes in the cervix. °If there are no prenatal problems or other health problems associated with the pregnancy, it is completely safe to be sent home with false labor and await the onset of true labor. °HOW CAN YOU TELL THE DIFFERENCE BETWEEN TRUE AND FALSE LABOR? °False Labor °· The contractions of false labor are usually shorter and not as hard as those of true labor.   °· The contractions are usually irregular.   °· The contractions are often felt in the front of the lower abdomen and in the groin.   °· The contractions may go away when you walk around or change positions while lying down.   °· The contractions get weaker and are shorter lasting as time goes on.   °· The contractions do not usually become progressively stronger, regular, and closer together as with true labor.   °True Labor °1. Contractions in true labor last 30-70 seconds, become very regular, usually become more intense, and increase in frequency.   °2. The contractions do not go away with walking.   °3. The discomfort is usually felt in the top of the uterus and spreads to the lower abdomen and low back.   °4. True labor can  be determined by your health care provider with an exam. This will show that the cervix is dilating and getting thinner.   °WHAT TO REMEMBER °· Keep up with your usual exercises and follow other instructions given by your health care provider.   °· Take medicines as directed by your health care provider.   °· Keep your regular prenatal appointments.   °· Eat and drink lightly if you think you are going into labor.   °· If Braxton Hicks contractions are making you uncomfortable:   °· Change your position from lying down or resting to walking, or from walking to resting.   °· Sit and rest in a tub of warm water.   °· Drink 2-3 glasses of water. Dehydration may cause these contractions.   °· Do slow and deep breathing several times an hour.   °WHEN SHOULD I SEEK IMMEDIATE MEDICAL CARE? °Seek immediate medical care if: °· Your contractions become stronger, more regular, and closer together.   °· You have fluid leaking or gushing from your vagina.   °· You have a fever.   °· You pass blood-tinged mucus.   °· You have vaginal bleeding.   °· You have continuous abdominal pain.   °· You have low back pain that you never had before.   °· You feel your baby's head pushing down and causing pelvic pressure.   °· Your baby is not moving as much as it used to.   °Document Released: 06/29/2005 Document Revised: 07/04/2013 Document Reviewed: 04/10/2013 °[employer]® Patient Information ©2015 [employer]. This information is not intended to replace advice given to you by your health care   provider. Make sure you discuss any questions you have with your health care provider. ° °Fetal Movement Counts °Patient Name: __________________________________________________ Patient Due Date: ____________________ °Performing a fetal movement count is highly recommended in high-risk pregnancies, but it is good for every pregnant woman to do. Your health care provider may ask you to start counting fetal movements at 28 weeks of the pregnancy. Fetal  movements often increase: °· After eating a full meal. °· After physical activity. °· After eating or drinking something sweet or cold. °· At rest. °Pay attention to when you feel the baby is most active. This will help you notice a pattern of your baby's sleep and wake cycles and what factors contribute to an increase in fetal movement. It is important to perform a fetal movement count at the same time each day when your baby is normally most active.  °HOW TO COUNT FETAL MOVEMENTS °5. Find a quiet and comfortable area to sit or lie down on your left side. Lying on your left side provides the best blood and oxygen circulation to your baby. °6. Write down the day and time on a sheet of paper or in a journal. °7. Start counting kicks, flutters, swishes, rolls, or jabs in a 2-hour period. You should feel at least 10 movements within 2 hours. °8. If you do not feel 10 movements in 2 hours, wait 2-3 hours and count again. Look for a change in the pattern or not enough counts in 2 hours. °SEEK MEDICAL CARE IF: °· You feel less than 10 counts in 2 hours, tried twice. °· There is no movement in over an hour. °· The pattern is changing or taking longer each day to reach 10 counts in 2 hours. °· You feel the baby is not moving as he or she usually does. °Date: ____________ Movements: ____________ Start time: ____________ Finish time: ____________  °Date: ____________ Movements: ____________ Start time: ____________ Finish time: ____________ °Date: ____________ Movements: ____________ Start time: ____________ Finish time: ____________ °Date: ____________ Movements: ____________ Start time: ____________ Finish time: ____________ °Date: ____________ Movements: ____________ Start time: ____________ Finish time: ____________ °Date: ____________ Movements: ____________ Start time: ____________ Finish time: ____________ °Date: ____________ Movements: ____________ Start time: ____________ Finish time: ____________ °Date: ____________  Movements: ____________ Start time: ____________ Finish time: ____________  °Date: ____________ Movements: ____________ Start time: ____________ Finish time: ____________ °Date: ____________ Movements: ____________ Start time: ____________ Finish time: ____________ °Date: ____________ Movements: ____________ Start time: ____________ Finish time: ____________ °Date: ____________ Movements: ____________ Start time: ____________ Finish time: ____________ °Date: ____________ Movements: ____________ Start time: ____________ Finish time: ____________ °Date: ____________ Movements: ____________ Start time: ____________ Finish time: ____________ °Date: ____________ Movements: ____________ Start time: ____________ Finish time: ____________  °Date: ____________ Movements: ____________ Start time: ____________ Finish time: ____________ °Date: ____________ Movements: ____________ Start time: ____________ Finish time: ____________ °Date: ____________ Movements: ____________ Start time: ____________ Finish time: ____________ °Date: ____________ Movements: ____________ Start time: ____________ Finish time: ____________ °Date: ____________ Movements: ____________ Start time: ____________ Finish time: ____________ °Date: ____________ Movements: ____________ Start time: ____________ Finish time: ____________ °Date: ____________ Movements: ____________ Start time: ____________ Finish time: ____________  °Date: ____________ Movements: ____________ Start time: ____________ Finish time: ____________ °Date: ____________ Movements: ____________ Start time: ____________ Finish time: ____________ °Date: ____________ Movements: ____________ Start time: ____________ Finish time: ____________ °Date: ____________ Movements: ____________ Start time: ____________ Finish time: ____________ °Date: ____________ Movements: ____________ Start time: ____________ Finish time: ____________ °Date: ____________ Movements: ____________ Start time:  ____________ Finish time: ____________ °Date: ____________ Movements:   ____________ Start time: ____________ Finish time: ____________  °Date: ____________ Movements: ____________ Start time: ____________ Finish time: ____________ °Date: ____________ Movements: ____________ Start time: ____________ Finish time: ____________ °Date: ____________ Movements: ____________ Start time: ____________ Finish time: ____________ °Date: ____________ Movements: ____________ Start time: ____________ Finish time: ____________ °Date: ____________ Movements: ____________ Start time: ____________ Finish time: ____________ °Date: ____________ Movements: ____________ Start time: ____________ Finish time: ____________ °Date: ____________ Movements: ____________ Start time: ____________ Finish time: ____________  °Date: ____________ Movements: ____________ Start time: ____________ Finish time: ____________ °Date: ____________ Movements: ____________ Start time: ____________ Finish time: ____________ °Date: ____________ Movements: ____________ Start time: ____________ Finish time: ____________ °Date: ____________ Movements: ____________ Start time: ____________ Finish time: ____________ °Date: ____________ Movements: ____________ Start time: ____________ Finish time: ____________ °Date: ____________ Movements: ____________ Start time: ____________ Finish time: ____________ °Date: ____________ Movements: ____________ Start time: ____________ Finish time: ____________  °Date: ____________ Movements: ____________ Start time: ____________ Finish time: ____________ °Date: ____________ Movements: ____________ Start time: ____________ Finish time: ____________ °Date: ____________ Movements: ____________ Start time: ____________ Finish time: ____________ °Date: ____________ Movements: ____________ Start time: ____________ Finish time: ____________ °Date: ____________ Movements: ____________ Start time: ____________ Finish time: ____________ °Date:  ____________ Movements: ____________ Start time: ____________ Finish time: ____________ °Date: ____________ Movements: ____________ Start time: ____________ Finish time: ____________  °Date: ____________ Movements: ____________ Start time: ____________ Finish time: ____________ °Date: ____________ Movements: ____________ Start time: ____________ Finish time: ____________ °Date: ____________ Movements: ____________ Start time: ____________ Finish time: ____________ °Date: ____________ Movements: ____________ Start time: ____________ Finish time: ____________ °Date: ____________ Movements: ____________ Start time: ____________ Finish time: ____________ °Date: ____________ Movements: ____________ Start time: ____________ Finish time: ____________ °Document Released: 07/29/2006 Document Revised: 11/13/2013 Document Reviewed: 04/25/2012 °[employer]® Patient Information ©2015 [employer]. This information is not intended to replace advice given to you by your health care provider. Make sure you discuss any questions you have with your health care provider. ° °

## 2014-10-01 NOTE — Progress Notes (Signed)
GBS, cultures today. Normal growth US 3/18.

## 2014-10-02 LAB — GC/CHLAMYDIA PROBE AMP
CT Probe RNA: NEGATIVE
GC Probe RNA: NEGATIVE

## 2014-10-03 LAB — CULTURE, BETA STREP (GROUP B ONLY)

## 2014-10-08 ENCOUNTER — Ambulatory Visit (INDEPENDENT_AMBULATORY_CARE_PROVIDER_SITE_OTHER): Payer: Medicaid Other | Admitting: Advanced Practice Midwife

## 2014-10-08 VITALS — BP 95/55 | HR 93 | Wt 169.0 lb

## 2014-10-08 DIAGNOSIS — O99820 Streptococcus B carrier state complicating pregnancy: Secondary | ICD-10-CM

## 2014-10-08 DIAGNOSIS — Z22330 Carrier of Group B streptococcus: Secondary | ICD-10-CM

## 2014-10-08 NOTE — Progress Notes (Signed)
Doing well.  Good fetal movement, denies vaginal bleeding, LOF, regular contractions.  Reviewed signs of labor/reasons to come to hospital. Discussed GBS positive with pt, need for prophylaxis in labor.

## 2014-10-12 ENCOUNTER — Encounter (HOSPITAL_COMMUNITY): Payer: Self-pay | Admitting: *Deleted

## 2014-10-12 ENCOUNTER — Inpatient Hospital Stay (HOSPITAL_COMMUNITY)
Admission: AD | Admit: 2014-10-12 | Discharge: 2014-10-12 | Disposition: A | Discharge status: Home / Self Care | Payer: Medicaid Other | Source: Ambulatory Visit | Attending: Obstetrics & Gynecology | Admitting: Obstetrics & Gynecology

## 2014-10-12 DIAGNOSIS — Z3A39 39 weeks gestation of pregnancy: Secondary | ICD-10-CM | POA: Insufficient documentation

## 2014-10-12 NOTE — MAU Note (Signed)
Pt presents to MAU with complaints of contractions every three minutes. Denies any vaginal bleeding.

## 2014-10-15 ENCOUNTER — Encounter: Payer: Self-pay | Admitting: Advanced Practice Midwife

## 2014-10-15 ENCOUNTER — Telehealth (HOSPITAL_COMMUNITY): Payer: Self-pay | Admitting: *Deleted

## 2014-10-15 ENCOUNTER — Ambulatory Visit (INDEPENDENT_AMBULATORY_CARE_PROVIDER_SITE_OTHER): Payer: Medicaid Other | Admitting: Advanced Practice Midwife

## 2014-10-15 VITALS — BP 107/64 | HR 85 | Wt 174.0 lb

## 2014-10-15 DIAGNOSIS — O0933 Supervision of pregnancy with insufficient antenatal care, third trimester: Secondary | ICD-10-CM

## 2014-10-15 DIAGNOSIS — Z86711 Personal history of pulmonary embolism: Secondary | ICD-10-CM

## 2014-10-15 NOTE — Progress Notes (Signed)
IOL schedule for 10/22/14 at 0700 with cytotec.  Last dose of Lovenox at 6pm Sunday per Dr Penne LashLeggett.

## 2014-10-15 NOTE — Telephone Encounter (Signed)
Preadmission screen  

## 2014-10-15 NOTE — Patient Instructions (Signed)

## 2014-10-22 ENCOUNTER — Encounter (HOSPITAL_COMMUNITY): Payer: Self-pay

## 2014-10-22 ENCOUNTER — Inpatient Hospital Stay (HOSPITAL_COMMUNITY)
Admission: RE | Admit: 2014-10-22 | Discharge: 2014-10-23 | DRG: 775 | Disposition: A | Discharge status: Home / Self Care | Payer: Medicaid Other | Source: Ambulatory Visit | Attending: Family Medicine | Admitting: Family Medicine

## 2014-10-22 VITALS — BP 101/64 | HR 81 | Temp 97.7°F | Resp 16 | Ht 71.0 in | Wt 174.0 lb

## 2014-10-22 DIAGNOSIS — Z3A39 39 weeks gestation of pregnancy: Secondary | ICD-10-CM | POA: Diagnosis present

## 2014-10-22 DIAGNOSIS — Z3490 Encounter for supervision of normal pregnancy, unspecified, unspecified trimester: Secondary | ICD-10-CM

## 2014-10-22 DIAGNOSIS — Z86711 Personal history of pulmonary embolism: Secondary | ICD-10-CM

## 2014-10-22 DIAGNOSIS — O98519 Other viral diseases complicating pregnancy, unspecified trimester: Secondary | ICD-10-CM

## 2014-10-22 DIAGNOSIS — O09892 Supervision of other high risk pregnancies, second trimester: Secondary | ICD-10-CM

## 2014-10-22 DIAGNOSIS — B009 Herpesviral infection, unspecified: Secondary | ICD-10-CM

## 2014-10-22 DIAGNOSIS — O0933 Supervision of pregnancy with insufficient antenatal care, third trimester: Secondary | ICD-10-CM

## 2014-10-22 DIAGNOSIS — Z3483 Encounter for supervision of other normal pregnancy, third trimester: Secondary | ICD-10-CM | POA: Diagnosis present

## 2014-10-22 DIAGNOSIS — Z7901 Long term (current) use of anticoagulants: Secondary | ICD-10-CM

## 2014-10-22 DIAGNOSIS — O99824 Streptococcus B carrier state complicating childbirth: Principal | ICD-10-CM | POA: Diagnosis present

## 2014-10-22 DIAGNOSIS — O99820 Streptococcus B carrier state complicating pregnancy: Secondary | ICD-10-CM

## 2014-10-22 DIAGNOSIS — O09212 Supervision of pregnancy with history of pre-term labor, second trimester: Secondary | ICD-10-CM

## 2014-10-22 LAB — CBC
HCT: 36.3 % (ref 36.0–46.0)
HEMATOCRIT: 36.6 % (ref 36.0–46.0)
HEMOGLOBIN: 12.1 g/dL (ref 12.0–15.0)
Hemoglobin: 12.3 g/dL (ref 12.0–15.0)
MCH: 30.7 pg (ref 26.0–34.0)
MCH: 30.6 pg (ref 26.0–34.0)
MCHC: 33.6 g/dL (ref 30.0–36.0)
MCHC: 33.3 g/dL (ref 30.0–36.0)
MCV: 91.3 fL (ref 78.0–100.0)
MCV: 91.7 fL (ref 78.0–100.0)
Platelets: 176 10*3/uL (ref 150–400)
Platelets: 154 10*3/uL (ref 150–400)
RBC: 4.01 MIL/uL (ref 3.87–5.11)
RBC: 3.96 MIL/uL (ref 3.87–5.11)
RDW: 13.4 % (ref 11.5–15.5)
RDW: 13.4 % (ref 11.5–15.5)
WBC: 9 10*3/uL (ref 4.0–10.5)
WBC: 15.5 10*3/uL — ABNORMAL HIGH (ref 4.0–10.5)

## 2014-10-22 LAB — CREATININE, SERUM
CREATININE: 0.6 mg/dL (ref 0.50–1.10)
GFR calc Af Amer: >90 mL/min (ref >90)
GFR calc non Af Amer: >90 mL/min (ref >90)

## 2014-10-22 LAB — PREPARE RBC (CROSSMATCH)

## 2014-10-22 LAB — ABO/RH: ABO/RH(D): A POS

## 2014-10-22 LAB — RPR: RPR: NONREACTIVE

## 2014-10-22 MED ORDER — PRENATAL VITAMINS 0.8 MG PO TABS: 1.0000 | ORAL_TABLET | Freq: Every day | ORAL | Status: DC

## 2014-10-22 MED ORDER — PHENYLEPHRINE 40 MCG/ML (10ML) SYRINGE FOR IV PUSH (FOR BLOOD PRESSURE SUPPORT)
80.0000 ug | PREFILLED_SYRINGE | INTRAVENOUS | Status: DC | PRN
  Filled 2014-10-22: qty 2

## 2014-10-22 MED ORDER — OXYTOCIN 40 UNITS IN LACTATED RINGERS INFUSION - SIMPLE MED
62.5000 mL/h | INTRAVENOUS | Status: DC
  Administered 2014-10-22: 999 mL/h via INTRAVENOUS

## 2014-10-22 MED ORDER — OXYCODONE-ACETAMINOPHEN 5-325 MG PO TABS
1.0000 | ORAL_TABLET | ORAL | Status: DC | PRN
  Administered 2014-10-23: 1 via ORAL
  Filled 2014-10-22: qty 1

## 2014-10-22 MED ORDER — PENICILLIN G POTASSIUM 5000000 UNITS IJ SOLR
5.0000 10*6.[IU] | Freq: Once | INTRAVENOUS | Status: AC
  Administered 2014-10-22: 5 10*6.[IU] via INTRAVENOUS
  Filled 2014-10-22: qty 5

## 2014-10-22 MED ORDER — IBUPROFEN 600 MG PO TABS
600.0000 mg | ORAL_TABLET | Freq: Four times a day (QID) | ORAL | Status: DC
  Administered 2014-10-22 (×2): 600 mg via ORAL
  Filled 2014-10-22 (×2): qty 1

## 2014-10-22 MED ORDER — ACETAMINOPHEN 325 MG PO TABS: 650.0000 mg | ORAL_TABLET | ORAL | Status: DC | PRN

## 2014-10-22 MED ORDER — EPHEDRINE 5 MG/ML INJ
10.0000 mg | INTRAVENOUS | Status: DC | PRN
  Filled 2014-10-22: qty 2

## 2014-10-22 MED ORDER — EPHEDRINE 5 MG/ML INJ
10.0000 mg | INTRAVENOUS | Status: DC | PRN
Start: 2014-10-22 — End: 2014-10-22
  Filled 2014-10-22: qty 2

## 2014-10-22 MED ORDER — PRENATAL MULTIVITAMIN CH: 1.0000 | ORAL_TABLET | Freq: Every day | ORAL | Status: DC

## 2014-10-22 MED ORDER — DIPHENHYDRAMINE HCL 50 MG/ML IJ SOLN: 12.5000 mg | INTRAMUSCULAR | Status: DC | PRN

## 2014-10-22 MED ORDER — PHENYLEPHRINE 40 MCG/ML (10ML) SYRINGE FOR IV PUSH (FOR BLOOD PRESSURE SUPPORT)
80.0000 ug | PREFILLED_SYRINGE | INTRAVENOUS | Status: DC | PRN
Start: 2014-10-22 — End: 2014-10-22
  Filled 2014-10-22: qty 2

## 2014-10-22 MED ORDER — ENOXAPARIN SODIUM 40 MG/0.4ML ~~LOC~~ SOLN
40.0000 mg | SUBCUTANEOUS | Status: DC
Start: 2014-10-22 — End: 2014-10-22

## 2014-10-22 MED ORDER — DIBUCAINE 1 % RE OINT: 1.0000 "application " | TOPICAL_OINTMENT | RECTAL | Status: DC | PRN

## 2014-10-22 MED ORDER — CITRIC ACID-SODIUM CITRATE 334-500 MG/5ML PO SOLN: 30.0000 mL | ORAL | Status: DC | PRN

## 2014-10-22 MED ORDER — PRENATAL MULTIVITAMIN CH
1.0000 | ORAL_TABLET | Freq: Every day | ORAL | Status: DC
  Administered 2014-10-23: 1 via ORAL
  Filled 2014-10-22: qty 1

## 2014-10-22 MED ORDER — TERBUTALINE SULFATE 1 MG/ML IJ SOLN
0.2500 mg | Freq: Once | INTRAMUSCULAR | Status: DC | PRN
  Filled 2014-10-22: qty 1

## 2014-10-22 MED ORDER — OXYTOCIN BOLUS FROM INFUSION: 500.0000 mL | INTRAVENOUS | Status: DC

## 2014-10-22 MED ORDER — FENTANYL 2.5 MCG/ML BUPIVACAINE 1/10 % EPIDURAL INFUSION (WH - ANES): 14.0000 mL/h | INTRAMUSCULAR | Status: DC | PRN

## 2014-10-22 MED ORDER — LIDOCAINE HCL (PF) 1 % IJ SOLN
30.0000 mL | INTRAMUSCULAR | Status: DC | PRN
  Filled 2014-10-22: qty 30

## 2014-10-22 MED ORDER — OXYCODONE-ACETAMINOPHEN 5-325 MG PO TABS: 1.0000 | ORAL_TABLET | ORAL | Status: DC | PRN

## 2014-10-22 MED ORDER — PENICILLIN G POTASSIUM 5000000 UNITS IJ SOLR
2.5000 10*6.[IU] | INTRAVENOUS | Status: DC
  Administered 2014-10-22: 2.5 10*6.[IU] via INTRAVENOUS
  Filled 2014-10-22 (×5): qty 2.5

## 2014-10-22 MED ORDER — SIMETHICONE 80 MG PO CHEW: 80.0000 mg | CHEWABLE_TABLET | ORAL | Status: DC | PRN

## 2014-10-22 MED ORDER — SENNOSIDES-DOCUSATE SODIUM 8.6-50 MG PO TABS: 2.0000 | ORAL_TABLET | ORAL | Status: DC

## 2014-10-22 MED ORDER — ZOLPIDEM TARTRATE 5 MG PO TABS: 5.0000 mg | ORAL_TABLET | Freq: Every evening | ORAL | Status: DC | PRN

## 2014-10-22 MED ORDER — OXYTOCIN 40 UNITS IN LACTATED RINGERS INFUSION - SIMPLE MED
1.0000 m[IU]/min | INTRAVENOUS | Status: DC
  Administered 2014-10-22: 2 m[IU]/min via INTRAVENOUS
  Filled 2014-10-22: qty 1000

## 2014-10-22 MED ORDER — LACTATED RINGERS IV SOLN: 500.0000 mL | INTRAVENOUS | Status: DC | PRN

## 2014-10-22 MED ORDER — FLUCONAZOLE 150 MG PO TABS
150.0000 mg | ORAL_TABLET | Freq: Once | ORAL | Status: AC
  Administered 2014-10-22: 150 mg via ORAL
  Filled 2014-10-22: qty 1

## 2014-10-22 MED ORDER — ENOXAPARIN SODIUM 40 MG/0.4ML ~~LOC~~ SOLN
40.0000 mg | SUBCUTANEOUS | Status: DC
  Administered 2014-10-23: 40 mg via SUBCUTANEOUS
  Filled 2014-10-22 (×2): qty 0.4

## 2014-10-22 MED ORDER — LACTATED RINGERS IV SOLN
INTRAVENOUS | Status: DC
Start: 2014-10-22 — End: 2014-10-22
  Administered 2014-10-22: 08:00:00 via INTRAVENOUS

## 2014-10-22 MED ORDER — ACETAMINOPHEN 325 MG PO TABS
650.0000 mg | ORAL_TABLET | ORAL | Status: DC | PRN
  Administered 2014-10-22 – 2014-10-23 (×2): 650 mg via ORAL
  Filled 2014-10-22 (×3): qty 2

## 2014-10-22 MED ORDER — VALACYCLOVIR HCL 500 MG PO TABS
1000.0000 mg | ORAL_TABLET | Freq: Every day | ORAL | Status: DC
  Administered 2014-10-23: 1000 mg via ORAL
  Filled 2014-10-22 (×4): qty 2

## 2014-10-22 MED ORDER — ONDANSETRON HCL 4 MG PO TABS: 4.0000 mg | ORAL_TABLET | ORAL | Status: DC | PRN

## 2014-10-22 MED ORDER — ONDANSETRON HCL 4 MG/2ML IJ SOLN: 4.0000 mg | Freq: Four times a day (QID) | INTRAMUSCULAR | Status: DC | PRN

## 2014-10-22 MED ORDER — OXYCODONE-ACETAMINOPHEN 5-325 MG PO TABS: 2.0000 | ORAL_TABLET | ORAL | Status: DC | PRN

## 2014-10-22 MED ORDER — ONDANSETRON HCL 4 MG/2ML IJ SOLN: 4.0000 mg | INTRAMUSCULAR | Status: DC | PRN

## 2014-10-22 MED ORDER — LANOLIN HYDROUS EX OINT: TOPICAL_OINTMENT | CUTANEOUS | Status: DC | PRN

## 2014-10-22 MED ORDER — DIPHENHYDRAMINE HCL 25 MG PO CAPS: 25.0000 mg | ORAL_CAPSULE | Freq: Four times a day (QID) | ORAL | Status: DC | PRN

## 2014-10-22 MED ORDER — WITCH HAZEL-GLYCERIN EX PADS: 1.0000 "application " | MEDICATED_PAD | CUTANEOUS | Status: DC | PRN

## 2014-10-22 MED ORDER — LACTATED RINGERS IV SOLN: 500.0000 mL | Freq: Once | INTRAVENOUS | Status: DC

## 2014-10-22 MED ORDER — BENZOCAINE-MENTHOL 20-0.5 % EX AERO: 1.0000 "application " | INHALATION_SPRAY | CUTANEOUS | Status: DC | PRN

## 2014-10-22 MED ORDER — TETANUS-DIPHTH-ACELL PERTUSSIS 5-2.5-18.5 LF-MCG/0.5 IM SUSP: 0.5000 mL | Freq: Once | INTRAMUSCULAR | Status: DC

## 2014-10-22 NOTE — Progress Notes (Signed)
Bridget Clark is a 34 y.o. [phone] at 4541w0d by ultrasound admitted for induction of labor due to PE on lovenox.  Subjective: Patient reports that she is doing well.  Denies excessive discomfort.  Objective: BP 110/64 mmHg  Pulse 106  Temp(Src) 98.9 F (37.2 C) (Oral)  Resp 18  Ht 5\' 11"  (1.803 m)  Wt 174 lb (78.926 kg)  BMI 24.28 kg/m2  LMP 01/26/2014 (Approximate)     FHT:  FHR: 130 bpm, variability: moderate,  accelerations:  Present,  decelerations:  Absent UC:   irregular SVE:   Dilation: 2.5 cm Effacement (%): 50 Station: -3  Labs: Lab Results  Component Value Date   WBC 9.0 10/22/2014   HGB 12.3 10/22/2014   HCT 36.6 10/22/2014   MCV 91.3 10/22/2014   PLT 176 10/22/2014    Assessment / Plan: Induction of labor due to PE on Lovenox (last Lovenox Saturday).  Starting pitocin now.  Labor: Initiating pitocin now.  Considered FB but cervix now 2-3cm dilated. Fetal Wellbeing:  Category I Pain Control:  none for now.   I/D:  GBS+, PCN running. Anticipated MOD:  NSVD  Raliegh IpGottschalk, Sairah Knobloch M, DO 10/22/2014, 9:03 AM

## 2014-10-22 NOTE — Progress Notes (Signed)
LABOR PROGRESS NOTE  Dierdre Harnessiffany Slinker is a 34 y.o. [phone] at 1981w0d  admitted for induction of labor due to history of PE, on Lovenox.  Subjective: Contractions becoming more painful, but otherwise doing well.   Objective: BP 103/74 mmHg  Pulse 81  Temp(Src) 98.9 F (37.2 C) (Oral)  Resp 16  Ht 5\' 11"  (1.803 m)  Wt 174 lb (78.926 kg)  BMI 24.28 kg/m2  LMP 01/26/2014 (Approximate) or  Filed Vitals:   10/22/14 1101 10/22/14 1116 10/22/14 1216 10/22/14 1316  BP: 104/70 109/71 106/65 103/74  Pulse: 74 82 76 81  Temp:      TempSrc:      Resp:   16   Height:      Weight:           FHT:  FHR: 125 bpm, variability: moderate,  accelerations:  Present,  decelerations:  Absent UC:   regular, every 2-3 minutes SVE:   Dilation: 3 Effacement (%): 50 Station: -2 Exam by:: Trenae Brunke  Dilation: 3 Effacement (%): 50 Cervical Position: Middle Station: -2 Presentation: Vertex Exam by:: Larsen Zettel  Pitocin @ 12 mu/min  Labs: Lab Results  Component Value Date   WBC 9.0 10/22/2014   HGB 12.3 10/22/2014   HCT 36.6 10/22/2014   MCV 91.3 10/22/2014   PLT 176 10/22/2014    Assessment / Plan: Induction of labor due to history of PE, on lovenox,  progressing well on pitocin  Labor: Early induction. Now contracting regularly. AROM at 1340, clear fluid. Continue to increase pitocin per protocol.   History of PE. Holding lovenox. Plan to restart at 12-24 hours post partum.  Fetal Wellbeing:  Category I Pain Control:  Labor support without medications Anticipated MOD:  NSVD  William DaltonMcEachern, Eran Windish, MD 10/22/2014, 1:44 PM

## 2014-10-22 NOTE — H&P (Signed)
Bridget Clark is a 34 y.o. female presenting for IOL for PE on lovenox. Denies bleeding, LOF or significant contractions, just occasional braxton hicks. Normal FM. Reports  History OB History    Gravida Para Term Preterm AB TAB SAB Ectopic Multiple Living   4 3 2 1  0 0 0 0 0 3     Past Medical History  Diagnosis Date  . HSV-2 (herpes simplex virus 2) infection   . Abnormal Pap smear of cervix     2009, colpo, normal pap after  . Pulmonary embolism    Past Surgical History  Procedure Laterality Date  . No past surgeries     Family History: family history includes Diabetes in her mother. Social History:  reports that she has never smoked. She has never used smokeless tobacco. She reports that she drinks alcohol. She reports that she does not use illicit drugs.   Prenatal Transfer Tool  Maternal Diabetes: No Genetic Screening: Normal Maternal Ultrasounds/Referrals: Normal Fetal Ultrasounds or other Referrals:  Referred to Materal Fetal Medicine  Maternal Substance Abuse:  No Significant Maternal Medications:  Meds include: Other: lovenox Significant Maternal Lab Results:  Lab values include: Group B Strep positive Other Comments:  None  ROS See HPI    Last menstrual period 01/26/2014. Exam Physical Exam  Nursing note and vitals reviewed. Constitutional: She is oriented to person, place, and time. She appears well-developed and well-nourished. No distress.  HENT:  Head: Normocephalic and atraumatic.  Eyes: Conjunctivae are normal. Right eye exhibits no discharge. Left eye exhibits no discharge.  Cardiovascular: Normal rate.   Respiratory: Effort normal and breath sounds normal. No respiratory distress. She has no wheezes. She has no rales.  GI: Soft. There is no tenderness.  Genitourinary: Vagina normal and uterus normal.  Neurological: She is alert and oriented to person, place, and time.  Skin: Skin is warm and dry. She is not diaphoretic.  Psychiatric: She has a  normal mood and affect. Her behavior is normal.     Dilation: Fingertip Effacement (%): 50 Cervical Position: Middle Station: -3 Presentation: Vertex Exam by:: Hart RochesterLawson, CNM  Prenatal labs: ABO, Rh: A/POS/-- (01/15 1028) Antibody: NEG (01/15 1028) Rubella: 3.69 (01/15 1028) RPR: NON REAC (01/25 1038)  HBsAg: NEGATIVE (01/15 1028)  HIV: NONREACTIVE (01/25 1038)  GBS: Positive (03/21 0000)   Assessment/Plan: Labor Plan #Labor: Induction for PE on lovenox, cervix soft and fingertip in multip, pitocin ordered #Pain: epidural on request #FWB: Not yet on monitors #ID: GBS positive - PCN ordered    Beverely Lowdamo, Elena 10/22/2014, 7:30 AM i agree with above assessment

## 2014-10-23 LAB — CBC
HCT: 34.1 % — ABNORMAL LOW (ref 36.0–46.0)
Hemoglobin: 11.3 g/dL — ABNORMAL LOW (ref 12.0–15.0)
MCH: 30.3 pg (ref 26.0–34.0)
MCHC: 33.1 g/dL (ref 30.0–36.0)
MCV: 91.4 fL (ref 78.0–100.0)
Platelets: 153 10*3/uL (ref 150–400)
RBC: 3.73 MIL/uL — AB (ref 3.87–5.11)
RDW: 13.7 % (ref 11.5–15.5)
WBC: 11.7 10*3/uL — ABNORMAL HIGH (ref 4.0–10.5)

## 2014-10-23 MED ORDER — IBUPROFEN 600 MG PO TABS: 600.0000 mg | ORAL_TABLET | Freq: Four times a day (QID) | ORAL | Status: AC

## 2014-10-23 MED ORDER — OXYCODONE-ACETAMINOPHEN 5-325 MG PO TABS: 1.0000 | ORAL_TABLET | ORAL | Status: AC | PRN

## 2014-10-23 NOTE — Discharge Summary (Cosign Needed)
Obstetric Discharge Summary Reason for Admission: induction of labor for h/o PE Prenatal Procedures: NST Intrapartum Procedures: spontaneous vaginal delivery Postpartum Procedures: none Complications-Operative and Postpartum: 1st degree perineal laceration, no repair needed HEMOGLOBIN  Date Value Ref Range Status  10/23/2014 11.3* 12.0 - 15.0 g/dL Final   HCT  Date Value Ref Range Status  10/23/2014 34.1* 36.0 - 46.0 % Final    Physical Exam:  General: alert, cooperative and no distress Lochia: appropriate Uterine Fundus: firm Incision: n/a DVT Evaluation: No evidence of DVT seen on physical exam. No cords or calf tenderness. No significant calf/ankle edema.  Discharge Diagnoses: Term Pregnancy-delivered and pulmonary embolism  Discharge Information: Date: 10/23/2014 Activity: pelvic rest Diet: routine Medications: PNV and Ibuprofen, lovenox Condition: stable Instructions: refer to practice specific booklet Discharge to: home  Contraception: partner planning vasectomy Follow-up Information    [facility] [place]. Schedule an appointment as soon as possible for a visit in 6 weeks.   Specialty:  Obstetrics and Gynecology   Contact information:   [address], [address] [place] [id number] [phone]      Newborn Data: Live born female  Birth Weight: 7 lb 0.5 oz (3190 g) APGAR: 9, 9  Home with mother.  Beverely Lowdamo, Elena 10/23/2014, 7:13 AM    OB fellow attestation I have seen and examined this patient and agree with above documentation in the resident's note.   Dierdre Harnessiffany Lilja is a 34 y.o. [id number] s/p NSVD.   Pain is well controlled.  Plan for birth control is vasectomy.  Method of Feeding: breast  PE:  BP 101/64 mmHg  Pulse 81  Temp(Src) 97.7 F (36.5 C) (Oral)  Resp 16  Ht 5\' 11"  (1.803 m)  Wt 174 lb (78.926 kg)  BMI 24.28 kg/m2  SpO2 100%  LMP 01/26/2014 (Approximate)  Breastfeeding?  Unknown Fundus firm   Recent Labs  10/22/14 1840 10/23/14 0525  HGB 12.1 11.3*  HCT 36.3 34.1*     Plan: discharge today - postpartum care discussed - f/u clinic in 6 weeks for postpartum visit   William DaltonMcEachern, Brookley Spitler, MD 7:47 AM

## 2014-10-23 NOTE — Discharge Instructions (Signed)

## 2014-10-23 NOTE — Progress Notes (Signed)
UR chart review completed.  

## 2014-10-23 NOTE — Lactation Note (Signed)
This note was copied from the chart of Bridget Clark. Lactation Consultation Note  Initial visit made.  This is mom's fourth baby and first time breastfeeding.  She feels baby is latching and nursing well.  Reviewed basics and encouraged to call for concerns/assist prn.  Breastfeeding consultation services and support information given to mom.  Patient Name: Bridget Dierdre Harnessiffany Caratachea ZOXWR'UToday's Date: 10/23/2014 Reason for consult: Initial assessment   Maternal Data    Feeding    LATCH Score/Interventions                      Lactation Tools Discussed/Used     Consult Status Consult Status: Follow-up Date: 10/23/14    Huston FoleyMOULDEN, [place] Every S 10/23/2014, 10:13 AM

## 2014-10-24 LAB — TYPE AND SCREEN
ABO/RH(D): A POS
ANTIBODY SCREEN: NEGATIVE
Unit division: 0
Unit division: 0

## 2014-12-07 ENCOUNTER — Ambulatory Visit: Payer: Medicaid Other | Admitting: Advanced Practice Midwife

## 2017-02-21 ENCOUNTER — Inpatient Hospital Stay
Admit: 2017-02-21 | Discharge: 2017-02-22 | Disposition: A | Discharge status: Home / Self Care | Payer: Self-pay | Attending: Emergency Medicine

## 2017-02-21 DIAGNOSIS — R1013 Epigastric pain: Secondary | ICD-10-CM

## 2017-02-21 LAB — URINALYSIS W/ RFLX MICROSCOPIC
Bilirubin: NEGATIVE
Blood: NEGATIVE
Glucose: NEGATIVE mg/dL
Ketone: NEGATIVE mg/dL
Leukocyte Esterase: NEGATIVE
Nitrites: NEGATIVE
Protein: NEGATIVE mg/dL
Specific gravity: 1.022 (ref 1.005–1.030)
Urobilinogen: 1 EU/dL (ref 0.2–1.0)
pH (UA): 5.5 (ref 5.0–8.0)

## 2017-02-21 LAB — HCG URINE, QL: HCG urine, QL: NEGATIVE

## 2017-02-21 NOTE — ED Notes (Signed)
I have reviewed discharge instructions with the patient.  The patient verbalized understanding.Discharge medications reviewed with patient and appropriate educational materials and side effects teaching were provided.Patient armband removed and shredded

## 2017-02-21 NOTE — ED Provider Notes (Signed)
HPI Comments: Pt reports episode of epigastric pain earlier tonight that has since resolved since being in ED. States she felt a cramp in the upper abdomen, felt like a knot. Denies nausea, vomiting. Pain worse with certain positions, deep breaths. No sob, cp, nausea, vomiting, diarrhea, urinary sx, back pain.  Pain resolved when walking around prior to coming back to ED room.     Patient is a 36 y.o. female presenting with epigastric pain. The history is provided by the patient.   Epigastric Pain    This is a new problem. The problem occurs constantly. The problem has been resolved. The pain is located in the epigastric region. The quality of the pain is aching and dull. Pertinent negatives include no anorexia, no fever, no belching, no diarrhea, no flatus, no hematochezia, no melena, no nausea, no vomiting, no constipation, no dysuria, no frequency, no hematuria, no headaches and no back pain. Nothing worsens the pain. The pain is relieved by nothing. Past workup includes no CT scan, no ultrasound, no surgery, no esophagogastroduodenoscopy, no colonoscopy.        No past medical history on file.    No past surgical history on file.      No family history on file.    Social History     Social History   ??? Marital status: MARRIED     Spouse name: N/A   ??? Number of children: N/A   ??? Years of education: N/A     Occupational History   ??? Not on file.     Social History Main Topics   ??? Smoking status: Not on file   ??? Smokeless tobacco: Not on file   ??? Alcohol use Not on file   ??? Drug use: Not on file   ??? Sexual activity: Not on file     Other Topics Concern   ??? Not on file     Social History Narrative         ALLERGIES: Sulfa (sulfonamide antibiotics)    Review of Systems   Constitutional: Negative for fever.   Gastrointestinal: Positive for abdominal pain. Negative for anorexia, constipation, diarrhea, flatus, hematochezia, melena, nausea and vomiting.   Genitourinary: Negative for dysuria, frequency and hematuria.    Musculoskeletal: Negative for back pain.   Neurological: Negative for headaches.   All other systems reviewed and are negative.      Vitals:    02/21/17 1916   BP: 109/67   Pulse: 99   Resp: 15   Temp: 98.8 ??F (37.1 ??C)   SpO2: 100%   Weight: 79.4 kg (175 lb)            Physical Exam   Constitutional: She is oriented to person, place, and time. She appears well-developed and well-nourished. No distress.   NAD, well hydrated, non toxic     HENT:   Head: Normocephalic and atraumatic.   Nose: Nose normal.   Mouth/Throat: Oropharynx is clear and moist. No oropharyngeal exudate.   Eyes: Conjunctivae and EOM are normal. Pupils are equal, round, and reactive to light.   Neck: Normal range of motion. Neck supple.   Cardiovascular: Normal rate, regular rhythm and normal heart sounds.    No murmur heard.  Pulmonary/Chest: Effort normal and breath sounds normal. No respiratory distress. She has no wheezes. She has no rales.   Abdominal: Soft. Bowel sounds are normal. She exhibits no distension. There is no tenderness. There is no rebound and no guarding.   Musculoskeletal: Normal  range of motion.   Lymphadenopathy:     She has no cervical adenopathy.   Neurological: She is alert and oriented to person, place, and time. No cranial nerve deficit. Coordination normal.   Skin: Skin is warm. No rash noted. She is not diaphoretic.   Psychiatric: She has a normal mood and affect. Her behavior is normal.   Nursing note and vitals reviewed.       MDM  Number of Diagnoses or Management Options  Abdominal pain, epigastric:   Diagnosis management comments: Pt presents ambulatory in NAD, well-hydrated, non-toxic in appearance, with normal vitals.  Benign exam of abdomen with no TTP, no peritoneal signs. Unremarkable labs. Tolerating PO. Pt appears well, comfortable. Doubt acute surgical process. Do not feel that any emergent imaging is warranted at this time. Will DC home           ED Course       Procedures

## 2017-02-21 NOTE — ED Triage Notes (Signed)
Patient arrived from home c/o upper abdominal pain.  Patient denies pregnancy. Patient rates pain 9/10

## 2017-02-22 LAB — CBC WITH AUTOMATED DIFF
ABS. BASOPHILS: 0.1 10*3/uL (ref 0.0–0.1)
ABS. EOSINOPHILS: 0.2 10*3/uL (ref 0.0–0.4)
ABS. LYMPHOCYTES: 2.8 10*3/uL (ref 0.9–3.6)
ABS. MONOCYTES: 0.9 10*3/uL (ref 0.05–1.2)
ABS. NEUTROPHILS: 4.1 10*3/uL (ref 1.8–8.0)
BASOPHILS: 1 % (ref 0–2)
EOSINOPHILS: 2 % (ref 0–5)
HCT: 34.6 % — ABNORMAL LOW (ref 35.0–45.0)
HGB: 11.1 g/dL — ABNORMAL LOW (ref 12.0–16.0)
LYMPHOCYTES: 35 % (ref 21–52)
MCH: 28.5 PG (ref 24.0–34.0)
MCHC: 32.1 g/dL (ref 31.0–37.0)
MCV: 88.7 FL (ref 74.0–97.0)
MONOCYTES: 11 % — ABNORMAL HIGH (ref 3–10)
MPV: 11 FL (ref 9.2–11.8)
NEUTROPHILS: 51 % (ref 40–73)
PLATELET: 291 10*3/uL (ref 135–420)
RBC: 3.9 M/uL — ABNORMAL LOW (ref 4.20–5.30)
RDW: 12.8 % (ref 11.6–14.5)
WBC: 8 10*3/uL (ref 4.6–13.2)

## 2017-02-22 LAB — METABOLIC PANEL, COMPREHENSIVE
A-G Ratio: 0.9 (ref 0.8–1.7)
ALT (SGPT): 22 U/L (ref 13–56)
AST (SGOT): 13 U/L — ABNORMAL LOW (ref 15–37)
Albumin: 3.7 g/dL (ref 3.4–5.0)
Alk. phosphatase: 47 U/L (ref 45–117)
Anion gap: 8 mmol/L (ref 3.0–18)
BUN/Creatinine ratio: 15 (ref 12–20)
BUN: 14 MG/DL (ref 7.0–18)
Bilirubin, total: 0.2 MG/DL (ref 0.2–1.0)
CO2: 29 mmol/L (ref 21–32)
Calcium: 8.8 MG/DL (ref 8.5–10.1)
Chloride: 106 mmol/L (ref 100–108)
Creatinine: 0.93 MG/DL (ref 0.6–1.3)
GFR est AA: >60 mL/min/{1.73_m2} (ref >60)
GFR est non-AA: >60 mL/min/{1.73_m2} (ref >60)
Globulin: 3.9 g/dL (ref 2.0–4.0)
Glucose: 73 mg/dL — ABNORMAL LOW (ref 74–99)
Potassium: 3.6 mmol/L (ref 3.5–5.5)
Protein, total: 7.6 g/dL (ref 6.4–8.2)
Sodium: 143 mmol/L (ref 136–145)

## 2017-02-22 LAB — LIPASE: Lipase: 134 U/L (ref 73–393)

## 2017-02-25 NOTE — ED Triage Notes (Signed)
Patient c/o dizziness and feeling like she was going to black out. Patient states that she also experienced some abdominal cramping  Prior to the dizziness occurring.Patient states that as the dizziness was occurring her vision started to become blurry and everything became black.

## 2017-02-26 ENCOUNTER — Inpatient Hospital Stay: Admit: 2017-02-26 | Discharge: 2017-02-26 | Payer: Self-pay | Attending: Emergency Medicine

## 2017-07-27 ENCOUNTER — Inpatient Hospital Stay
Admit: 2017-07-27 | Discharge: 2017-07-27 | Disposition: A | Discharge status: Home / Self Care | Payer: PRIVATE HEALTH INSURANCE | Attending: Emergency Medicine

## 2017-07-27 ENCOUNTER — Emergency Department: Admit: 2017-07-27 | Payer: PRIVATE HEALTH INSURANCE

## 2017-07-27 DIAGNOSIS — R0600 Dyspnea, unspecified: Secondary | ICD-10-CM

## 2017-07-27 LAB — D-DIMER, QUANTITATIVE: D-Dimer, Quant: 0.8 ug/ml(FEU) — ABNORMAL HIGH (ref <0.46)

## 2017-07-27 LAB — EKG 12-LEAD
Atrial Rate: 80 {beats}/min
Diagnosis: NORMAL
P Axis: 81 degrees
P-R Interval: 146 ms
Q-T Interval: 362 ms
QRS Duration: 78 ms
QTc Calculation (Bazett): 417 ms
R Axis: 62 degrees
T Axis: 76 degrees
Ventricular Rate: 80 {beats}/min

## 2017-07-27 LAB — HEPATIC FUNCTION PANEL
A-G Ratio: 0.9 (ref 0.8–1.7)
ALT (SGPT): 17 U/L (ref 13–56)
AST (SGOT): 11 U/L — ABNORMAL LOW (ref 15–37)
Albumin: 4 g/dL (ref 3.4–5.0)
Alk. phosphatase: 57 U/L (ref 45–117)
Bilirubin, direct: 0.2 MG/DL (ref 0.0–0.2)
Bilirubin, total: 0.5 MG/DL (ref 0.2–1.0)
Globulin: 4.3 g/dL — ABNORMAL HIGH (ref 2.0–4.0)
Protein, total: 8.3 g/dL — ABNORMAL HIGH (ref 6.4–8.2)

## 2017-07-27 LAB — METABOLIC PANEL, BASIC
Anion gap: 6 mmol/L (ref 3.0–18)
BUN/Creatinine ratio: 10 — ABNORMAL LOW (ref 12–20)
BUN: 9 MG/DL (ref 7.0–18)
CO2: 27 mmol/L (ref 21–32)
Calcium: 8.6 MG/DL (ref 8.5–10.1)
Chloride: 107 mmol/L (ref 100–108)
Creatinine: 0.94 MG/DL (ref 0.6–1.3)
GFR est AA: >60 mL/min/{1.73_m2} (ref >60)
GFR est non-AA: >60 mL/min/{1.73_m2} (ref >60)
Glucose: 99 mg/dL (ref 74–99)
Potassium: 3.4 mmol/L — ABNORMAL LOW (ref 3.5–5.5)
Sodium: 140 mmol/L (ref 136–145)

## 2017-07-27 LAB — EKG, 12 LEAD, INITIAL
Atrial Rate: 80 {beats}/min
Calculated P Axis: 81 degrees
Calculated R Axis: 62 degrees
Calculated T Axis: 76 degrees
Diagnosis: NORMAL
P-R Interval: 146 ms
Q-T Interval: 362 ms
QRS Duration: 78 ms
QTC Calculation (Bezet): 417 ms
Ventricular Rate: 80 {beats}/min

## 2017-07-27 LAB — CBC WITH AUTOMATED DIFF
ABS. BASOPHILS: 0 10*3/uL (ref 0.0–0.1)
ABS. EOSINOPHILS: 0.1 10*3/uL (ref 0.0–0.4)
ABS. LYMPHOCYTES: 3.4 10*3/uL (ref 0.9–3.6)
ABS. MONOCYTES: 0.5 10*3/uL (ref 0.05–1.2)
ABS. NEUTROPHILS: 3.3 10*3/uL (ref 1.8–8.0)
BASOPHILS: 0 % (ref 0–2)
EOSINOPHILS: 1 % (ref 0–5)
HCT: 41.3 % (ref 35.0–45.0)
HGB: 13.5 g/dL (ref 12.0–16.0)
LYMPHOCYTES: 46 % (ref 21–52)
MCH: 28.7 PG (ref 24.0–34.0)
MCHC: 32.7 g/dL (ref 31.0–37.0)
MCV: 87.9 FL (ref 74.0–97.0)
MONOCYTES: 7 % (ref 3–10)
MPV: 10.8 FL (ref 9.2–11.8)
NEUTROPHILS: 46 % (ref 40–73)
PLATELET: 295 10*3/uL (ref 135–420)
RBC: 4.7 M/uL (ref 4.20–5.30)
RDW: 13.4 % (ref 11.6–14.5)
WBC: 7.3 10*3/uL (ref 4.6–13.2)

## 2017-07-27 LAB — CARDIAC PANEL,(CK, CKMB & TROPONIN)
CK - MB: <1 ng/ml (ref <3.6)
CK: 122 U/L (ref 26–192)
Troponin-I, QT: <0.02 NG/ML (ref 0.0–0.045)

## 2017-07-27 LAB — PROTHROMBIN TIME + INR
INR: 1 (ref 0.8–1.2)
Prothrombin time: 13.2 s (ref 11.5–15.2)

## 2017-07-27 LAB — D DIMER: D DIMER: 0.8 ug/ml(FEU) — ABNORMAL HIGH (ref <0.46)

## 2017-07-27 LAB — HCG QL SERUM: HCG, Ql.: NEGATIVE

## 2017-07-27 MED ORDER — BISACODYL 5 MG TAB, DELAYED RELEASE
5 mg | ORAL_TABLET | ORAL | 0 refills | Status: AC
Start: 2017-07-27 — End: ?

## 2017-07-27 MED ORDER — MAGNESIUM CITRATE ORAL SOLN
ORAL | 0 refills | Status: AC
Start: 2017-07-27 — End: ?

## 2017-07-27 MED ORDER — IOPAMIDOL 76 % IV SOLN
370 mg iodine /mL (76 %) | Freq: Once | INTRAVENOUS | Status: AC
Start: 2017-07-27 — End: 2017-07-27
  Administered 2017-07-27: 20:00:00 via INTRAVENOUS

## 2017-07-27 MED ORDER — ONDANSETRON (PF) 4 MG/2 ML INJECTION
4 mg/2 mL | Freq: Once | INTRAMUSCULAR | Status: DC
Start: 2017-07-27 — End: 2017-07-27

## 2017-07-27 MED ORDER — MORPHINE 2 MG/ML INJECTION
2 mg/mL | INTRAMUSCULAR | Status: DC
Start: 2017-07-27 — End: 2017-07-27

## 2017-07-27 MED FILL — ISOVUE-370  76 % INTRAVENOUS SOLUTION: 370 mg iodine /mL (76 %) | INTRAVENOUS | Qty: 100

## 2017-07-27 NOTE — ED Provider Notes (Signed)
EMERGENCY DEPARTMENT HISTORY AND PHYSICAL EXAM    12:51 PM      Date: 07/27/2017  Patient Name: Paula Kemp    History of Presenting Illness     Chief Complaint   Patient presents with   ??? Shoulder Pain   ??? Shortness of Breath         History Provided By: Patient and Patient's Husband    Chief Complaint: Shortness of breath  Duration:  Minutes  Timing:  Acute  Location: Right sided  Quality: Sharp  Severity: Moderate  Modifying Factors: worse with movment and deep breath  Associated Symptoms: hx of PE      Additional History (Context): Paula Kemp is a 37 y.o. female with PE who presents with acute onset of shortness of breath, chest pain, and abdominal pain. She reports your last PE was unprovoked, and recently had blood worked that she might be more at risk for blood clots. She is not on any blood thinners, no recent surgeries, no casts, non smoker, no OCPS. Does report recent URI/no trauma.       PCP: None        Past History     Past Medical History:  No past medical history on file.    Past Surgical History:  No past surgical history on file.    Family History:  No family history on file.    Social History:  Social History     Tobacco Use   ??? Smoking status: Not on file   Substance Use Topics   ??? Alcohol use: Not on file   ??? Drug use: Not on file       Allergies:  Allergies   Allergen Reactions   ??? Sulfa (Sulfonamide Antibiotics) Nausea and Vomiting         Review of Systems       Review of Systems   Constitutional: Negative for activity change, appetite change and fever.   HENT: Negative for congestion, sinus pressure, sinus pain, trouble swallowing and voice change.    Eyes: Negative for discharge and visual disturbance.   Respiratory: Positive for chest tightness and shortness of breath. Negative for apnea, cough, choking, wheezing and stridor.    Cardiovascular: Positive for chest pain. Negative for palpitations and leg swelling.   Gastrointestinal: Negative for nausea and vomiting.    Endocrine: Negative for polydipsia, polyphagia and polyuria.   Genitourinary: Negative for decreased urine volume, difficulty urinating, flank pain, vaginal bleeding and vaginal discharge.   Musculoskeletal: Negative for joint swelling.   Skin: Negative for color change, pallor, rash and wound.   Neurological: Negative for dizziness, weakness and light-headedness.   Psychiatric/Behavioral: Negative for agitation and hallucinations.         Physical Exam     Visit Vitals  BP (!) 130/91 (BP 1 Location: Left arm, BP Patient Position: At rest)   Pulse 92   Temp 98 ??F (36.7 ??C)   Resp 16   Ht _0  (1.803 m)   Wt 81.6 kg (180 lb)   SpO2 100%   BMI 25.10 kg/m??         Physical Exam   Constitutional: She is oriented to person, place, and time. She appears well-developed and well-nourished.   In pain   HENT:   Head: Normocephalic and atraumatic.   Right Ear: External ear normal.   Left Ear: External ear normal.   Nose: Nose normal.   Mouth/Throat: Oropharynx is clear and moist.   Eyes: Conjunctivae and  EOM are normal. Pupils are equal, round, and reactive to light. Right eye exhibits no discharge. Left eye exhibits no discharge. No scleral icterus.   Neck: Normal range of motion. Neck supple. No JVD present. No tracheal deviation present. No thyromegaly present.   Cardiovascular: Normal rate, regular rhythm, normal heart sounds and intact distal pulses. Exam reveals no gallop and no friction rub.   No murmur heard.  Pulmonary/Chest: Effort normal and breath sounds normal. No stridor. No respiratory distress. She has no wheezes. She has no rales. She exhibits tenderness.   Abdominal: Soft. Bowel sounds are normal. She exhibits no distension and no mass. There is no rebound and no guarding.   Tenderness in epigastric region   Musculoskeletal: Normal range of motion. She exhibits no edema, tenderness or deformity.   Lymphadenopathy:     She has no cervical adenopathy.    Neurological: She is alert and oriented to person, place, and time. She has normal reflexes. She displays normal reflexes. No cranial nerve deficit. She exhibits normal muscle tone. Coordination normal.   Skin: Skin is warm and dry. No rash noted. She is not diaphoretic. No erythema. No pallor.   Psychiatric: She has a normal mood and affect. Her behavior is normal. Judgment and thought content normal.         Diagnostic Study Results     Labs -  Recent Results (from the past 12 hour(s))   EKG, 12 LEAD, INITIAL    Collection Time: 07/27/17 12:22 PM   Result Value Ref Range    Ventricular Rate 80 BPM    Atrial Rate 80 BPM    P-R Interval 146 ms    QRS Duration 78 ms    Q-T Interval 362 ms    QTC Calculation (Bezet) 417 ms    Calculated P Axis 81 degrees    Calculated R Axis 62 degrees    Calculated T Axis 76 degrees    Diagnosis       Normal sinus rhythm with sinus arrhythmia  Otherwise normal ECG  No previous ECGs available  Confirmed by Lutricia Feil, MD, Marc [phone]) on 07/27/2017 3:10:47 PM     CBC WITH AUTOMATED DIFF    Collection Time: 07/27/17 12:35 PM   Result Value Ref Range    WBC 7.3 4.6 - 13.2 K/uL    RBC 4.70 4.20 - 5.30 M/uL    HGB 13.5 12.0 - 16.0 g/dL    HCT 41.3 35.0 - 45.0 %    MCV 87.9 74.0 - 97.0 FL    MCH 28.7 24.0 - 34.0 PG    MCHC 32.7 31.0 - 37.0 g/dL    RDW 13.4 11.6 - 14.5 %    PLATELET 295 135 - 420 K/uL    MPV 10.8 9.2 - 11.8 FL    NEUTROPHILS 46 40 - 73 %    LYMPHOCYTES 46 21 - 52 %    MONOCYTES 7 3 - 10 %    EOSINOPHILS 1 0 - 5 %    BASOPHILS 0 0 - 2 %    ABS. NEUTROPHILS 3.3 1.8 - 8.0 K/UL    ABS. LYMPHOCYTES 3.4 0.9 - 3.6 K/UL    ABS. MONOCYTES 0.5 0.05 - 1.2 K/UL    ABS. EOSINOPHILS 0.1 0.0 - 0.4 K/UL    ABS. BASOPHILS 0.0 0.0 - 0.1 K/UL    DF AUTOMATED     METABOLIC PANEL, BASIC    Collection Time: 07/27/17 12:35 PM   Result Value Ref Range  Sodium 140 136 - 145 mmol/L    Potassium 3.4 (L) 3.5 - 5.5 mmol/L    Chloride 107 100 - 108 mmol/L    CO2 27 21 - 32 mmol/L     Anion gap 6 3.0 - 18 mmol/L    Glucose 99 74 - 99 mg/dL    BUN 9 7.0 - 18 MG/DL    Creatinine 0.94 0.6 - 1.3 MG/DL    BUN/Creatinine ratio 10 (L) 12 - 20      GFR est AA >60 >60 ml/min/1.78m    GFR est non-AA >60 >60 ml/min/1.742m   Calcium 8.6 8.5 - 10.1 MG/DL   CARDIAC PANEL,(CK, CKMB & TROPONIN)    Collection Time: 07/27/17 12:35 PM   Result Value Ref Range    CK 122 26 - 192 U/L    CK - MB <1.0 <3.6 ng/ml    CK-MB Index  0.0 - 4.0 %     CALCULATION NOT PERFORMED WHEN RESULT IS BELOW LINEAR LIMIT    Troponin-I, QT <0.02 0.0 - 0.045 NG/ML   D DIMER    Collection Time: 07/27/17 12:35 PM   Result Value Ref Range    D DIMER 0.80 (H) <0.46 ug/ml(FEU)   PROTHROMBIN TIME + INR    Collection Time: 07/27/17 12:35 PM   Result Value Ref Range    Prothrombin time 13.2 11.5 - 15.2 sec    INR 1.0 0.8 - 1.2     HCG QL SERUM    Collection Time: 07/27/17 12:35 PM   Result Value Ref Range    HCG, Ql. NEGATIVE  NEG     HEPATIC FUNCTION PANEL    Collection Time: 07/27/17 12:35 PM   Result Value Ref Range    Protein, total 8.3 (H) 6.4 - 8.2 g/dL    Albumin 4.0 3.4 - 5.0 g/dL    Globulin 4.3 (H) 2.0 - 4.0 g/dL    A-G Ratio 0.9 0.8 - 1.7      Bilirubin, total 0.5 0.2 - 1.0 MG/DL    Bilirubin, direct 0.2 0.0 - 0.2 MG/DL    Alk. phosphatase 57 45 - 117 U/L    AST (SGOT) 11 (L) 15 - 37 U/L    ALT (SGPT) 17 13 - 56 U/L       Radiologic Studies -   CTA CHEST W OR W WO CONT   Final Result   IMPRESSION:       No pulmonary embolism. No acute findings.      XR CHEST PORT   Final Result   IMPRESSION:      Negative chest.               Medical Decision Making   I am the first provider for this patient.    I reviewed the vital signs, available nursing notes, past medical history, past surgical history, family history and social history.    Vital Signs-Reviewed the patient's vital signs.    Pulse Oximetry Analysis -  100 on room air (Interpretation)    Cardiac Monitor:  Rate: 70  Rhythm:  Normal Sinus Rhythm     EKG:Interpreted by the EP.    Time Interpreted: 1224    Rate: 80   Rhythm: Normal Sinus Rhythm    Interpretation: some baseline wander, otherwise NSR   Comparison:     Records Reviewed: Nursing Notes, Old Medical Records, Previous Radiology Studies and Previous Laboratory Studies (Time of Review: 12:51 PM)    ED Course: Progress  Notes, Reevaluation, and Consults:  1:19 PM pt did not want any pain medications  2:38 PM: pt reports no pain or SOB at this time.     MDM:    Pt is a 8 yof that presents to the emergency room with recent acute onset of SOB. Hx of PE    Ddx: PE, pneumonia, fracture, pneumothroax, chest wall pain.     MDM: Pt had elevated D Dimer, possible hypercoagulable, SMDM occurred and pt agreed to another CT. She has a hx of 2 other CT chests for PE, and risks of cancer were discussed. Yet given patients symptoms she elected to progress with  the CT. The Ct results are pending,     No evidence of ACS at this time given initial trop was negative, no STEMI on ekg. No evidence of pneumonia, pneumothorax, or rib fracture seen on CXR.     Pts CXR shows Chilaiditi sign, which could be consistent with constipation. Pt reports not having a bowel movement for about 4 days. Pt is not vomiting, abdomen has minimal tenderness so unlikely to have SBO. Will offer out patent treatment for constipation.     Pt was turned over to Dr Leonard Schwartz at 1500 pending CT results.     Diagnosis     Clinical Impression:   1. Dyspnea, unspecified type    2. Other constipation      Disposition:     Follow-up Information    None             Medication List      You have not been prescribed any medications.       _______________________________    As a resident physician all patient care information is shared with my attending physician. All History, Physical Exam, Laboratory and Imaging results, Medical Decision Making, and Disposition is discussed and approved by my attending.     Hortense Ramal, DO PGY-2  Emergency Medicine Resident       _______________________________    Note:  Assuming care of patient    2:44 PM  I, Lawrence Santiago, MD, assumed care of patient at the beginning of my shift.    2:44 PM    Date: 07/27/2017  Patient Name: Paula Kemp    History of Presenting Illness     Chief Complaint   Patient presents with   ??? Shoulder Pain   ??? Shortness of Breath       Nursing notes regarding the HPI and triage nursing notes were reviewed.     Prior medical records were reviewed.     Current Facility-Administered Medications   Medication Dose Route Frequency Provider Last Rate Last Dose   ??? morphine injection 4 mg  4 mg IntraVENous NOW Lange, Kristopher R, DO       ??? ondansetron (ZOFRAN) injection 4 mg  4 mg IntraVENous ONCE Lange, Kristopher R, DO           Past History     Past Medical History:  No past medical history on file.    Past Surgical History:  No past surgical history on file.    Family History:  No family history on file.    Social History:  Social History     Tobacco Use   ??? Smoking status: Not on file   Substance Use Topics   ??? Alcohol use: Not on file   ??? Drug use: Not on file       Allergies:  Allergies   Allergen Reactions   ???  Sulfa (Sulfonamide Antibiotics) Nausea and Vomiting       Patient's primary care provider (as noted in EPIC):  None    Abnormal lab results from this emergency department encounter:  Labs Reviewed   METABOLIC PANEL, BASIC - Abnormal; Notable for the following components:       Result Value    Potassium 3.4 (*)     BUN/Creatinine ratio 10 (*)     All other components within normal limits   D DIMER - Abnormal; Notable for the following components:    D DIMER 0.80 (*)     All other components within normal limits   HEPATIC FUNCTION PANEL - Abnormal; Notable for the following components:    Protein, total 8.3 (*)     Globulin 4.3 (*)     AST (SGOT) 11 (*)     All other components within normal limits   CBC WITH AUTOMATED DIFF   CARDIAC PANEL,(CK, CKMB & TROPONIN)   PROTHROMBIN TIME + INR   HCG QL SERUM        Lab values for this patient within approximately the last 12 hours:  Recent Results (from the past 12 hour(s))   EKG, 12 LEAD, INITIAL    Collection Time: 07/27/17 12:22 PM   Result Value Ref Range    Ventricular Rate 80 BPM    Atrial Rate 80 BPM    P-R Interval 146 ms    QRS Duration 78 ms    Q-T Interval 362 ms    QTC Calculation (Bezet) 417 ms    Calculated P Axis 81 degrees    Calculated R Axis 62 degrees    Calculated T Axis 76 degrees    Diagnosis       Normal sinus rhythm with sinus arrhythmia  Otherwise normal ECG  No previous ECGs available  Confirmed by Lutricia Feil, MD, Marc [phone]) on 07/27/2017 3:10:47 PM     CBC WITH AUTOMATED DIFF    Collection Time: 07/27/17 12:35 PM   Result Value Ref Range    WBC 7.3 4.6 - 13.2 K/uL    RBC 4.70 4.20 - 5.30 M/uL    HGB 13.5 12.0 - 16.0 g/dL    HCT 41.3 35.0 - 45.0 %    MCV 87.9 74.0 - 97.0 FL    MCH 28.7 24.0 - 34.0 PG    MCHC 32.7 31.0 - 37.0 g/dL    RDW 13.4 11.6 - 14.5 %    PLATELET 295 135 - 420 K/uL    MPV 10.8 9.2 - 11.8 FL    NEUTROPHILS 46 40 - 73 %    LYMPHOCYTES 46 21 - 52 %    MONOCYTES 7 3 - 10 %    EOSINOPHILS 1 0 - 5 %    BASOPHILS 0 0 - 2 %    ABS. NEUTROPHILS 3.3 1.8 - 8.0 K/UL    ABS. LYMPHOCYTES 3.4 0.9 - 3.6 K/UL    ABS. MONOCYTES 0.5 0.05 - 1.2 K/UL    ABS. EOSINOPHILS 0.1 0.0 - 0.4 K/UL    ABS. BASOPHILS 0.0 0.0 - 0.1 K/UL    DF AUTOMATED     METABOLIC PANEL, BASIC    Collection Time: 07/27/17 12:35 PM   Result Value Ref Range    Sodium 140 136 - 145 mmol/L    Potassium 3.4 (L) 3.5 - 5.5 mmol/L    Chloride 107 100 - 108 mmol/L    CO2 27 21 - 32 mmol/L    Anion gap  6 3.0 - 18 mmol/L    Glucose 99 74 - 99 mg/dL    BUN 9 7.0 - 18 MG/DL    Creatinine 0.94 0.6 - 1.3 MG/DL    BUN/Creatinine ratio 10 (L) 12 - 20      GFR est AA >60 >60 ml/min/1.70m    GFR est non-AA >60 >60 ml/min/1.754m   Calcium 8.6 8.5 - 10.1 MG/DL   CARDIAC PANEL,(CK, CKMB & TROPONIN)    Collection Time: 07/27/17 12:35 PM   Result Value Ref Range    CK 122 26 - 192 U/L     CK - MB <1.0 <3.6 ng/ml    CK-MB Index  0.0 - 4.0 %     CALCULATION NOT PERFORMED WHEN RESULT IS BELOW LINEAR LIMIT    Troponin-I, QT <0.02 0.0 - 0.045 NG/ML   D DIMER    Collection Time: 07/27/17 12:35 PM   Result Value Ref Range    D DIMER 0.80 (H) <0.46 ug/ml(FEU)   PROTHROMBIN TIME + INR    Collection Time: 07/27/17 12:35 PM   Result Value Ref Range    Prothrombin time 13.2 11.5 - 15.2 sec    INR 1.0 0.8 - 1.2     HCG QL SERUM    Collection Time: 07/27/17 12:35 PM   Result Value Ref Range    HCG, Ql. NEGATIVE  NEG     HEPATIC FUNCTION PANEL    Collection Time: 07/27/17 12:35 PM   Result Value Ref Range    Protein, total 8.3 (H) 6.4 - 8.2 g/dL    Albumin 4.0 3.4 - 5.0 g/dL    Globulin 4.3 (H) 2.0 - 4.0 g/dL    A-G Ratio 0.9 0.8 - 1.7      Bilirubin, total 0.5 0.2 - 1.0 MG/DL    Bilirubin, direct 0.2 0.0 - 0.2 MG/DL    Alk. phosphatase 57 45 - 117 U/L    AST (SGOT) 11 (L) 15 - 37 U/L    ALT (SGPT) 17 13 - 56 U/L       Radiologist and cardiologist interpretations if available at time of this note:  Radiology results:  Cta Chest W Or W Wo Cont    Result Date: 07/27/2017  EXAM: CTA CHEST W OR W WO CONT INDICATION: Dyspnea, on exertion COMPARISON: None. Total DLP: 341.07 TECHNIQUE: Precontrast scout images were obtained to localize the volume for acquisition. Multislice helical CT arteriography was performed from the diaphragm to the thoracic inlet during uneventful rapid bolus intravenous contrast administration. Lung and soft tissue windows were generated.  Coronal and sagittal images were generated and 3D post processing consisting of coronal maximum intensity images was performed.  CT dose reduction was achieved through use of a standardized protocol tailored for this examination and automatic exposure control for dose modulation. FINDINGS: The lungs are clear of mass, nodule, airspace disease or edema. Minimal areas of linear subsegmental atelectasis and/or scarring. The pulmonary arteries are well enhanced and  no pulmonary emboli are identified. There is no mediastinal or hilar adenopathy or mass. The aorta enhances normally without evidence of aneurysm or dissection, given limitation of streak artifact. The visualized portions of the upper abdominal organs are normal. Trace rotoscoliosis. Minimal osseous degenerative changes.     IMPRESSION: No pulmonary embolism. No acute findings.    Xr Chest Port    Result Date: 07/27/2017  EXAM: CHEST ONE VIEW  portable 1220 hours CLINICAL HISTORY/INDICATION: Dyspnea with shoulder pain, markedly elevated blood pressure COMPARISON:  None. TECHNIQUE: One view obtained. FINDINGS: The cardiac and mediastinal silhouette is normal. The lungs are clear. Pulmonary vascularity is normal. The costophrenic angles are sharply defined. No bony abnormalities are seen.     IMPRESSION: Negative chest.         Medication(s) ordered for patient during this emergency visit encounter:  Medications   morphine injection 4 mg (not administered)   ondansetron (ZOFRAN) injection 4 mg (not administered)   iopamidol (ISOVUE-370) 76 % injection 75-100 mL (80 mL IntraVENous Given 07/27/17 1522)       Pt care assumed from resident physician, Dr. Delene Loll , ED provider. Pt complaint(s), current treatment plan, progression and available diagnostic results have been discussed thoroughly.  Rounding occurred: no  Intended Disposition: Home, ADMIT, Transfer, Extended Care Facility, TBD, AMA and Eloped   Pending diagnostic reports and/or labs (please list): CTA chest.     SPECIFIC PATIENT INSTRUCTIONS FROM THE PHYSICIAN WHO TREATED YOU IN THE ER TODAY:  1. Return if worse.  2. Follow up with your primary doctor in the next 1-2 days for outpatient reevaluation.    Patient is improved, resting quietly and comfortably.  The patient will be discharged home.     The patient was reassured that these symptoms do not appear to represent a serious or life threatening condition at this time. Warning signs of  worsening condition were discussed and understood by the patient.     Based on patient's age, coexisting illness, exam, and the results of this ED evaluation, the decision to treat as an outpatient was made. Based on the information available at time of discharge, acute pathology requiring immediate intervention was deemed relative unlikely.     While it is impossible to completely exclude the possibility of underlying serious disease or worsening of condition, I feel the relative likelihood is extremely low. I discussed this uncertainty with the patient, who understood ED evaluation and treatment and felt comfortable with the outpatient treatment plan.     All questions regarding care, test results, and follow up were answered. The patient is stable and appropriate to discharge. They understand that they should return to the emergency department for any new or worsening symptoms. I stressed the importance of follow up for repeat assessment and possibly further evaluation/treatment.    Coding Diagnoses     Clinical Impression:   1. Dyspnea, unspecified type    2. Other constipation        Disposition     Disposition:  Home.     Aaron Edelman L. Leonard Schwartz, M.D.  [facility] Board Certified Emergency Physician

## 2017-07-27 NOTE — ED Notes (Signed)
[facility] will contact patient on 07/28/17 with follow up scheduled  appointment date and time with Dr. Bertram GalaAnglin

## 2017-07-27 NOTE — ED Notes (Cosign Needed)
37 yo F with hx of PE who presents due to dyspnea, shoulder pain. Not on blood thinners.     I performed a brief evaluation, including history and physical, of the patient here in triage and I have determined that pt will need further treatment and evaluation from the main side ER physician.  I have placed initial orders to help in expediting patients care.     July 27, 2017 at 12:14 PM - Cordie Griceanielle N Scissom        Visit Vitals  BP (!) 130/91 (BP 1 Location: Left arm, BP Patient Position: At rest)   Pulse 92   Temp 98 ??F (36.7 ??C)   Resp 16   Ht 5\' 11"  (1.803 m)   Wt 81.6 kg (180 lb)   SpO2 100%   BMI 25.10 kg/m??

## 2017-07-27 NOTE — ED Notes (Signed)
Patient states "I don't need anything for pain, I feel much better."

## 2017-08-03 ENCOUNTER — Encounter: Attending: Emergency Medicine

## 2018-06-17 DIAGNOSIS — R109 Unspecified abdominal pain: Secondary | ICD-10-CM

## 2018-06-17 NOTE — ED Triage Notes (Signed)
C/o sudden onset abd pain x 10 minutes pta, denies n/v/d.  Pt reports decreased urination and increased thirst today

## 2018-06-17 NOTE — ED Notes (Signed)
C/o sudden onset abd pain x 10 minutes pta, denies n/v/d.  Pt reports decreased urination and increased thirst today

## 2018-06-18 ENCOUNTER — Inpatient Hospital Stay
Admit: 2018-06-18 | Discharge: 2018-06-18 | Disposition: A | Discharge status: Home / Self Care | Payer: PRIVATE HEALTH INSURANCE | Attending: Emergency Medicine

## 2018-06-18 ENCOUNTER — Inpatient Hospital Stay: Admit: 2018-06-18 | Discharge: 2018-06-18 | Payer: MEDICAID | Attending: Emergency Medicine

## 2018-06-18 ENCOUNTER — Emergency Department: Admit: 2018-06-18 | Payer: PRIVATE HEALTH INSURANCE

## 2018-06-18 DIAGNOSIS — N390 Urinary tract infection, site not specified: Secondary | ICD-10-CM

## 2018-06-18 LAB — COMPREHENSIVE METABOLIC PANEL
ALT: 17 U/L (ref 13–56)
AST: 10 U/L (ref 10–38)
Albumin/Globulin Ratio: 1.1 (ref 0.8–1.7)
Albumin: 3.8 g/dL (ref 3.4–5.0)
Alkaline Phosphatase: 51 U/L (ref 45–117)
Anion Gap: 8 mmol/L (ref 3.0–18)
BUN: 5 MG/DL — ABNORMAL LOW (ref 7.0–18)
Bun/Cre Ratio: 6 — ABNORMAL LOW (ref 12–20)
CO2: 27 mmol/L (ref 21–32)
Calcium: 8.8 MG/DL (ref 8.5–10.1)
Chloride: 107 mmol/L (ref 100–111)
Creatinine: 0.88 MG/DL (ref 0.6–1.3)
EGFR IF NonAfrican American: >60 mL/min/{1.73_m2} (ref >60)
GFR African American: >60 mL/min/{1.73_m2} (ref >60)
Globulin: 3.6 g/dL (ref 2.0–4.0)
Glucose: 106 mg/dL — ABNORMAL HIGH (ref 74–99)
Potassium: 3.4 mmol/L — ABNORMAL LOW (ref 3.5–5.5)
Sodium: 142 mmol/L (ref 136–145)
Total Bilirubin: 0.3 MG/DL (ref 0.2–1.0)
Total Protein: 7.4 g/dL (ref 6.4–8.2)

## 2018-06-18 LAB — URINE MICROSCOPIC ONLY
RBC, UA: 0 /hpf (ref 0–5)
RBC: 0 /hpf (ref 0–5)
WBC, UA: 4 /hpf (ref 0–4)
WBC: 4 /hpf (ref 0–4)

## 2018-06-18 LAB — CBC WITH AUTO DIFFERENTIAL
Basophils %: 0 % (ref 0–2)
Basophils Absolute: 0 10*3/uL (ref 0.0–0.1)
Eosinophils %: 1 % (ref 0–5)
Eosinophils Absolute: 0.1 10*3/uL (ref 0.0–0.4)
Hematocrit: 41.4 % (ref 35.0–45.0)
Hemoglobin: 13.3 g/dL (ref 12.0–16.0)
Lymphocytes %: 16 % — ABNORMAL LOW (ref 21–52)
Lymphocytes Absolute: 1.8 10*3/uL (ref 0.9–3.6)
MCH: 29.9 PG (ref 24.0–34.0)
MCHC: 32.1 g/dL (ref 31.0–37.0)
MCV: 93 FL (ref 74.0–97.0)
MPV: 11.4 FL (ref 9.2–11.8)
Monocytes %: 9 % (ref 3–10)
Monocytes Absolute: 1 10*3/uL (ref 0.05–1.2)
Neutrophils %: 74 % — ABNORMAL HIGH (ref 40–73)
Neutrophils Absolute: 8.6 10*3/uL — ABNORMAL HIGH (ref 1.8–8.0)
Platelets: 244 10*3/uL (ref 135–420)
RBC: 4.45 M/uL (ref 4.20–5.30)
RDW: 13.7 % (ref 11.6–14.5)
WBC: 11.5 10*3/uL (ref 4.6–13.2)

## 2018-06-18 LAB — HCG URINE, QL
HCG urine, QL: NEGATIVE
Pregnancy Test(Urn): NEGATIVE

## 2018-06-18 LAB — URINALYSIS W/ RFLX MICROSCOPIC
Bilirubin, Urine: NEGATIVE
Bilirubin: NEGATIVE
Blood, Urine: NEGATIVE
Blood: NEGATIVE
Glucose, Ur: NEGATIVE mg/dL
Glucose: NEGATIVE mg/dL
Nitrite, Urine: NEGATIVE
Nitrites: NEGATIVE
Protein, UA: NEGATIVE mg/dL
Protein: NEGATIVE mg/dL
Specific Gravity, UA: 1.02 (ref 1.005–1.030)
Specific gravity: 1.02 (ref 1.005–1.030)
Urobilinogen, UA, POCT: 1 EU/dL (ref 0.2–1.0)
Urobilinogen: 1 EU/dL (ref 0.2–1.0)
pH (UA): 6 (ref 5.0–8.0)
pH, UA: 6 (ref 5.0–8.0)

## 2018-06-18 LAB — LIPASE
Lipase: 101 U/L (ref 73–393)
Lipase: 101 U/L (ref 73–393)

## 2018-06-18 LAB — CBC WITH AUTOMATED DIFF
ABS. BASOPHILS: 0 10*3/uL (ref 0.0–0.1)
ABS. EOSINOPHILS: 0.1 10*3/uL (ref 0.0–0.4)
ABS. LYMPHOCYTES: 1.8 10*3/uL (ref 0.9–3.6)
ABS. MONOCYTES: 1 10*3/uL (ref 0.05–1.2)
ABS. NEUTROPHILS: 8.6 10*3/uL — ABNORMAL HIGH (ref 1.8–8.0)
BASOPHILS: 0 % (ref 0–2)
EOSINOPHILS: 1 % (ref 0–5)
HCT: 41.4 % (ref 35.0–45.0)
HGB: 13.3 g/dL (ref 12.0–16.0)
LYMPHOCYTES: 16 % — ABNORMAL LOW (ref 21–52)
MCH: 29.9 PG (ref 24.0–34.0)
MCHC: 32.1 g/dL (ref 31.0–37.0)
MCV: 93 FL (ref 74.0–97.0)
MONOCYTES: 9 % (ref 3–10)
MPV: 11.4 FL (ref 9.2–11.8)
NEUTROPHILS: 74 % — ABNORMAL HIGH (ref 40–73)
PLATELET: 244 10*3/uL (ref 135–420)
RBC: 4.45 M/uL (ref 4.20–5.30)
RDW: 13.7 % (ref 11.6–14.5)
WBC: 11.5 10*3/uL (ref 4.6–13.2)

## 2018-06-18 LAB — METABOLIC PANEL, COMPREHENSIVE
A-G Ratio: 1.1 (ref 0.8–1.7)
ALT (SGPT): 17 U/L (ref 13–56)
AST (SGOT): 10 U/L (ref 10–38)
Albumin: 3.8 g/dL (ref 3.4–5.0)
Alk. phosphatase: 51 U/L (ref 45–117)
Anion gap: 8 mmol/L (ref 3.0–18)
BUN/Creatinine ratio: 6 — ABNORMAL LOW (ref 12–20)
BUN: 5 MG/DL — ABNORMAL LOW (ref 7.0–18)
Bilirubin, total: 0.3 MG/DL (ref 0.2–1.0)
CO2: 27 mmol/L (ref 21–32)
Calcium: 8.8 MG/DL (ref 8.5–10.1)
Chloride: 107 mmol/L (ref 100–111)
Creatinine: 0.88 MG/DL (ref 0.6–1.3)
GFR est AA: >60 mL/min/{1.73_m2} (ref >60)
GFR est non-AA: >60 mL/min/{1.73_m2} (ref >60)
Globulin: 3.6 g/dL (ref 2.0–4.0)
Glucose: 106 mg/dL — ABNORMAL HIGH (ref 74–99)
Potassium: 3.4 mmol/L — ABNORMAL LOW (ref 3.5–5.5)
Protein, total: 7.4 g/dL (ref 6.4–8.2)
Sodium: 142 mmol/L (ref 136–145)

## 2018-06-18 MED ORDER — SODIUM CHLORIDE 0.9% BOLUS IV
0.9 % | Freq: Once | INTRAVENOUS | Status: AC
Start: 2018-06-18 — End: 2018-06-18
  Administered 2018-06-18: 07:00:00 via INTRAVENOUS

## 2018-06-18 MED ORDER — IOPAMIDOL 61 % IV SOLN
300 mg iodine /mL (61 %) | Freq: Once | INTRAVENOUS | Status: AC
Start: 2018-06-18 — End: 2018-06-18
  Administered 2018-06-18: 07:00:00 via INTRAVENOUS

## 2018-06-18 MED ORDER — CIPROFLOXACIN 500 MG TAB
500 mg | ORAL | Status: AC
Start: 2018-06-18 — End: 2018-06-18
  Administered 2018-06-18: 08:00:00 via ORAL

## 2018-06-18 MED ORDER — ONDANSETRON (PF) 4 MG/2 ML INJECTION
4 mg/2 mL | INTRAMUSCULAR | Status: AC
Start: 2018-06-18 — End: 2018-06-18
  Administered 2018-06-18: 07:00:00 via INTRAVENOUS

## 2018-06-18 MED ORDER — CIPROFLOXACIN 500 MG TAB
500 mg | ORAL_TABLET | Freq: Two times a day (BID) | ORAL | 0 refills | Status: AC
Start: 2018-06-18 — End: 2018-06-25

## 2018-06-18 MED FILL — ISOVUE-300  61 % INTRAVENOUS SOLUTION: 300 mg iodine /mL (61 %) | INTRAVENOUS | Qty: 100

## 2018-06-18 MED FILL — CIPROFLOXACIN 500 MG TAB: 500 mg | ORAL | Qty: 1

## 2018-06-18 MED FILL — SODIUM CHLORIDE 0.9 % IV: INTRAVENOUS | Qty: 1000

## 2018-06-18 MED FILL — ONDANSETRON (PF) 4 MG/2 ML INJECTION: 4 mg/2 mL | INTRAMUSCULAR | Qty: 2

## 2018-06-18 NOTE — ED Provider Notes (Signed)
HPI patient is a 37 year old female who presents to the ER with complaint of a sudden onset of right upper quadrant right lower quadrant pain started about 3 or 4 hours ago.  No fever chills vomiting diarrhea.  Patient last menses was 2 weeks ago.    Past Medical History:   Diagnosis Date   ??? HSV-2 (herpes simplex virus 2) infection    ??? Migraines    ??? Pulmonary embolism (HCC)        History reviewed. No pertinent surgical history.      History reviewed. No pertinent family history.    Social History     Socioeconomic History   ??? Marital status: MARRIED     Spouse name: Not on file   ??? Number of children: Not on file   ??? Years of education: Not on file   ??? Highest education level: Not on file   Occupational History   ??? Not on file   Social Needs   ??? Financial resource strain: Not on file   ??? Food insecurity:     Worry: Not on file     Inability: Not on file   ??? Transportation needs:     Medical: Not on file     Non-medical: Not on file   Tobacco Use   ??? Smoking status: Never Smoker   ??? Smokeless tobacco: Never Used   Substance and Sexual Activity   ??? Alcohol use: Yes   ??? Drug use: Never   ??? Sexual activity: Not on file   Lifestyle   ??? Physical activity:     Days per week: Not on file     Minutes per session: Not on file   ??? Stress: Not on file   Relationships   ??? Social connections:     Talks on phone: Not on file     Gets together: Not on file     Attends religious service: Not on file     Active member of club or organization: Not on file     Attends meetings of clubs or organizations: Not on file     Relationship status: Not on file   ??? Intimate partner violence:     Fear of current or ex partner: Not on file     Emotionally abused: Not on file     Physically abused: Not on file     Forced sexual activity: Not on file   Other Topics Concern   ??? Not on file   Social History Narrative   ??? Not on file         ALLERGIES: Sulfa (sulfonamide antibiotics)    Review of Systems   Constitutional: Negative.     HENT: Negative.    Eyes: Negative.    Respiratory: Negative.    Cardiovascular: Negative.    Gastrointestinal: Positive for abdominal pain.   Endocrine: Negative.    Genitourinary: Negative.    Musculoskeletal: Negative.    Skin: Negative.    Allergic/Immunologic: Negative.    Neurological: Negative.    Hematological: Negative.    Psychiatric/Behavioral: Negative.    All other systems reviewed and are negative.      Vitals:    06/18/18 0036   BP: 122/77   Pulse: 72   Resp: 18   Temp: 98.8 ??F (37.1 ??C)   SpO2: 99%   Weight: 88 kg (194 lb)   Height: 5\' 11"  (1.803 m)            Physical Exam  Vitals signs and nursing note reviewed.   Constitutional:       General: She is not in acute distress.     Appearance: She is well-developed. She is not diaphoretic.   HENT:      Head: Normocephalic.      Right Ear: External ear normal.      Left Ear: External ear normal.      Mouth/Throat:      Pharynx: No oropharyngeal exudate.   Eyes:      General: No scleral icterus.        Right eye: No discharge.         Left eye: No discharge.      Conjunctiva/sclera: Conjunctivae normal.      Pupils: Pupils are equal, round, and reactive to light.   Neck:      Musculoskeletal: Normal range of motion and neck supple.      Thyroid: No thyromegaly.      Vascular: No JVD.      Trachea: No tracheal deviation.   Cardiovascular:      Rate and Rhythm: Normal rate and regular rhythm.      Heart sounds: Normal heart sounds. No murmur. No friction rub. No gallop.    Pulmonary:      Effort: Pulmonary effort is normal. No respiratory distress.      Breath sounds: Normal breath sounds. No stridor. No wheezing or rales.   Chest:      Chest wall: No tenderness.   Abdominal:      General: Bowel sounds are normal. There is no distension.      Palpations: Abdomen is soft. There is no mass.      Tenderness: There is tenderness (mild) in the right upper quadrant and right lower quadrant. There is no guarding or rebound.    Musculoskeletal: Normal range of motion.         General: No tenderness.   Lymphadenopathy:      Cervical: No cervical adenopathy.   Skin:     General: Skin is warm and dry.      Coloration: Skin is not pale.      Findings: No erythema or rash.   Neurological:      Mental Status: She is alert and oriented to person, place, and time.      Cranial Nerves: No cranial nerve deficit.      Motor: No abnormal muscle tone.      Coordination: Coordination normal.      Deep Tendon Reflexes: Reflexes normal.         MDM   Dif Dx: Cholecystitis, esophagitis, diverticulitis, pancreatitis, GERD       Procedures  CT scan of abdomen - negative    Dx: UTI    Disp: D/C    Dictation disclaimer:  Please note that this dictation was completed with Dragon, the computer voice recognition software.  Quite often unanticipated grammatical, syntax, homophones, and other interpretive errors are inadvertently transcribed by the computer software.  Please disregard these errors.  Please excuse any errors that have escaped final proofreading.

## 2018-06-18 NOTE — ED Notes (Signed)
I have reviewed discharge instructions with the patient.  The patient verbalized understanding.  Patient armband removed and given to patient to take home.  Patient was informed of the privacy risks if armband lost or stolen  Current Discharge Medication List      START taking these medications    Details   ciprofloxacin HCl (CIPRO) 500 mg tablet Take 1 Tab by mouth two (2) times a day for 7 days.  Qty: 14 Tab, Refills: 0

## 2018-06-18 NOTE — ED Triage Notes (Signed)
Pt c/o RUQ abdominal pain, nausea, & lower back pain that began approx. 1.5 hours ago.

## 2018-06-18 NOTE — ED Notes (Signed)
I have reviewed discharge instructions with the patient.  The patient verbalized understanding.  Patient armband removed and given to patient to take home.  Patient was informed of the privacy risks if armband lost or stolen  Current Discharge Medication List      START taking these medications    Details   ciprofloxacin HCl (CIPRO) 500 mg tablet Take 1 Tab by mouth two (2) times a day for 7 days.  Qty: 14 Tab, Refills: 0

## 2018-06-18 NOTE — ED Notes (Signed)
Pt c/o RUQ abdominal pain, nausea, & lower back pain that began approx. 1.5 hours ago.

## 2018-06-18 NOTE — ED Provider Notes (Signed)
HPI patient is a 37 year old female who presents to the ER with complaint of a sudden onset of right upper quadrant right lower quadrant pain started about 3 or 4 hours ago.  No fever chills vomiting diarrhea.  Patient last menses was 2 weeks ago.    Past Medical History:   Diagnosis Date   ??? HSV-2 (herpes simplex virus 2) infection    ??? Migraines    ??? Pulmonary embolism (HCC)        History reviewed. No pertinent surgical history.      History reviewed. No pertinent family history.    Social History     Socioeconomic History   ??? Marital status: MARRIED     Spouse name: Not on file   ??? Number of children: Not on file   ??? Years of education: Not on file   ??? Highest education level: Not on file   Occupational History   ??? Not on file   Social Needs   ??? Financial resource strain: Not on file   ??? Food insecurity:     Worry: Not on file     Inability: Not on file   ??? Transportation needs:     Medical: Not on file     Non-medical: Not on file   Tobacco Use   ??? Smoking status: Never Smoker   ??? Smokeless tobacco: Never Used   Substance and Sexual Activity   ??? Alcohol use: Yes   ??? Drug use: Never   ??? Sexual activity: Not on file   Lifestyle   ??? Physical activity:     Days per week: Not on file     Minutes per session: Not on file   ??? Stress: Not on file   Relationships   ??? Social connections:     Talks on phone: Not on file     Gets together: Not on file     Attends religious service: Not on file     Active member of club or organization: Not on file     Attends meetings of clubs or organizations: Not on file     Relationship status: Not on file   ??? Intimate partner violence:     Fear of current or ex partner: Not on file     Emotionally abused: Not on file     Physically abused: Not on file     Forced sexual activity: Not on file   Other Topics Concern   ??? Not on file   Social History Narrative   ??? Not on file         ALLERGIES: Sulfa (sulfonamide antibiotics)    Review of Systems   Constitutional: Negative.    HENT:  Negative.    Eyes: Negative.    Respiratory: Negative.    Cardiovascular: Negative.    Gastrointestinal: Positive for abdominal pain.   Endocrine: Negative.    Genitourinary: Negative.    Musculoskeletal: Negative.    Skin: Negative.    Allergic/Immunologic: Negative.    Neurological: Negative.    Hematological: Negative.    Psychiatric/Behavioral: Negative.    All other systems reviewed and are negative.      Vitals:    06/18/18 0036   BP: 122/77   Pulse: 72   Resp: 18   Temp: 98.8 ??F (37.1 ??C)   SpO2: 99%   Weight: 88 kg (194 lb)   Height: 5\' 11"  (1.803 m)            Physical Exam  Vitals signs and nursing note reviewed.   Constitutional:       General: She is not in acute distress.     Appearance: She is well-developed. She is not diaphoretic.   HENT:      Head: Normocephalic.      Right Ear: External ear normal.      Left Ear: External ear normal.      Mouth/Throat:      Pharynx: No oropharyngeal exudate.   Eyes:      General: No scleral icterus.        Right eye: No discharge.         Left eye: No discharge.      Conjunctiva/sclera: Conjunctivae normal.      Pupils: Pupils are equal, round, and reactive to light.   Neck:      Musculoskeletal: Normal range of motion and neck supple.      Thyroid: No thyromegaly.      Vascular: No JVD.      Trachea: No tracheal deviation.   Cardiovascular:      Rate and Rhythm: Normal rate and regular rhythm.      Heart sounds: Normal heart sounds. No murmur. No friction rub. No gallop.    Pulmonary:      Effort: Pulmonary effort is normal. No respiratory distress.      Breath sounds: Normal breath sounds. No stridor. No wheezing or rales.   Chest:      Chest wall: No tenderness.   Abdominal:      General: Bowel sounds are normal. There is no distension.      Palpations: Abdomen is soft. There is no mass.      Tenderness: There is tenderness (mild) in the right upper quadrant and right lower quadrant. There is no guarding or rebound.   Musculoskeletal: Normal range of motion.          General: No tenderness.   Lymphadenopathy:      Cervical: No cervical adenopathy.   Skin:     General: Skin is warm and dry.      Coloration: Skin is not pale.      Findings: No erythema or rash.   Neurological:      Mental Status: She is alert and oriented to person, place, and time.      Cranial Nerves: No cranial nerve deficit.      Motor: No abnormal muscle tone.      Coordination: Coordination normal.      Deep Tendon Reflexes: Reflexes normal.         MDM   Dif Dx: Cholecystitis, esophagitis, diverticulitis, pancreatitis, GERD       Procedures  CT scan of abdomen - negative    Dx: UTI    Disp: D/C    Dictation disclaimer:  Please note that this dictation was completed with Dragon, the computer voice recognition software.  Quite often unanticipated grammatical, syntax, homophones, and other interpretive errors are inadvertently transcribed by the computer software.  Please disregard these errors.  Please excuse any errors that have escaped final proofreading.
